# Patient Record
Sex: Female | Born: 1980
Health system: Southern US, Community
[De-identification: ages and names within clinical notes are randomized; demographics above are authoritative.]

## PROBLEM LIST (undated history)

## (undated) ENCOUNTER — Inpatient Hospital Stay (HOSPITAL_COMMUNITY): Payer: Self-pay

## (undated) DIAGNOSIS — Z789 Other specified health status: Secondary | ICD-10-CM

## (undated) HISTORY — PX: CERVICAL BIOPSY  W/ LOOP ELECTRODE EXCISION: SUR135

---

## 2015-07-15 ENCOUNTER — Other Ambulatory Visit: Payer: Self-pay | Admitting: Obstetrics & Gynecology

## 2015-07-15 ENCOUNTER — Other Ambulatory Visit (HOSPITAL_COMMUNITY)
Admission: RE | Admit: 2015-07-15 | Discharge: 2015-07-15 | Disposition: A | Discharge status: Home / Self Care | Payer: BLUE CROSS/BLUE SHIELD | Source: Ambulatory Visit | Attending: Obstetrics & Gynecology | Admitting: Obstetrics & Gynecology

## 2015-07-15 DIAGNOSIS — Z1151 Encounter for screening for human papillomavirus (HPV): Secondary | ICD-10-CM | POA: Insufficient documentation

## 2015-07-15 DIAGNOSIS — Z01419 Encounter for gynecological examination (general) (routine) without abnormal findings: Secondary | ICD-10-CM | POA: Diagnosis present

## 2015-07-21 LAB — CYTOLOGY - PAP

## 2015-12-07 DIAGNOSIS — O209 Hemorrhage in early pregnancy, unspecified: Secondary | ICD-10-CM | POA: Diagnosis not present

## 2015-12-09 DIAGNOSIS — O209 Hemorrhage in early pregnancy, unspecified: Secondary | ICD-10-CM | POA: Diagnosis not present

## 2015-12-13 DIAGNOSIS — O09521 Supervision of elderly multigravida, first trimester: Secondary | ICD-10-CM | POA: Diagnosis not present

## 2015-12-13 DIAGNOSIS — O26899 Other specified pregnancy related conditions, unspecified trimester: Secondary | ICD-10-CM | POA: Diagnosis not present

## 2015-12-13 DIAGNOSIS — O26851 Spotting complicating pregnancy, first trimester: Secondary | ICD-10-CM | POA: Diagnosis not present

## 2015-12-22 DIAGNOSIS — Z3480 Encounter for supervision of other normal pregnancy, unspecified trimester: Secondary | ICD-10-CM | POA: Diagnosis not present

## 2015-12-22 LAB — OB RESULTS CONSOLE RPR: RPR: NONREACTIVE

## 2015-12-22 LAB — OB RESULTS CONSOLE HEPATITIS B SURFACE ANTIGEN: HEP B S AG: NEGATIVE

## 2015-12-22 LAB — OB RESULTS CONSOLE RUBELLA ANTIBODY, IGM: RUBELLA: IMMUNE

## 2016-01-07 DIAGNOSIS — O09521 Supervision of elderly multigravida, first trimester: Secondary | ICD-10-CM | POA: Diagnosis not present

## 2016-01-07 LAB — OB RESULTS CONSOLE HIV ANTIBODY (ROUTINE TESTING): HIV: NONREACTIVE

## 2016-01-07 LAB — OB RESULTS CONSOLE GC/CHLAMYDIA
CHLAMYDIA, DNA PROBE: NEGATIVE
GC PROBE AMP, GENITAL: NEGATIVE

## 2016-02-17 DIAGNOSIS — O3442 Maternal care for other abnormalities of cervix, second trimester: Secondary | ICD-10-CM | POA: Diagnosis not present

## 2016-04-28 ENCOUNTER — Inpatient Hospital Stay (HOSPITAL_COMMUNITY): Payer: BLUE CROSS/BLUE SHIELD

## 2016-04-28 ENCOUNTER — Encounter (HOSPITAL_COMMUNITY): Payer: Self-pay | Admitting: *Deleted

## 2016-04-28 ENCOUNTER — Inpatient Hospital Stay (HOSPITAL_COMMUNITY)
Admission: AD | Admit: 2016-04-28 | Discharge: 2016-04-28 | Disposition: A | Discharge status: Home / Self Care | Payer: BLUE CROSS/BLUE SHIELD | Source: Ambulatory Visit | Attending: Obstetrics & Gynecology | Admitting: Obstetrics & Gynecology

## 2016-04-28 DIAGNOSIS — R109 Unspecified abdominal pain: Secondary | ICD-10-CM | POA: Insufficient documentation

## 2016-04-28 DIAGNOSIS — O26892 Other specified pregnancy related conditions, second trimester: Secondary | ICD-10-CM | POA: Insufficient documentation

## 2016-04-28 DIAGNOSIS — R102 Pelvic and perineal pain: Secondary | ICD-10-CM | POA: Diagnosis present

## 2016-04-28 DIAGNOSIS — Z3A25 25 weeks gestation of pregnancy: Secondary | ICD-10-CM | POA: Insufficient documentation

## 2016-04-28 DIAGNOSIS — O4702 False labor before 37 completed weeks of gestation, second trimester: Secondary | ICD-10-CM

## 2016-04-28 DIAGNOSIS — Z9889 Other specified postprocedural states: Secondary | ICD-10-CM

## 2016-04-28 DIAGNOSIS — O3440 Maternal care for other abnormalities of cervix, unspecified trimester: Secondary | ICD-10-CM

## 2016-04-28 HISTORY — DX: Other specified health status: Z78.9

## 2016-04-28 LAB — URINALYSIS, ROUTINE W REFLEX MICROSCOPIC
BILIRUBIN URINE: NEGATIVE
Glucose, UA: NEGATIVE mg/dL
HGB URINE DIPSTICK: NEGATIVE
Ketones, ur: NEGATIVE mg/dL
NITRITE: NEGATIVE
Protein, ur: NEGATIVE mg/dL
RBC / HPF: NONE SEEN RBC/hpf (ref 0–5)
SPECIFIC GRAVITY, URINE: 1.003 — AB (ref 1.005–1.030)
pH: 6 (ref 5.0–8.0)

## 2016-04-28 LAB — FETAL FIBRONECTIN: FETAL FIBRONECTIN: NEGATIVE

## 2016-04-28 NOTE — MAU Provider Note (Signed)
leep since last baby Ctx 10 times/hour  FFN and cerv length [place] pending  Chief Complaint:  Contractions   First Provider Initiated Contact with Patient 04/28/16 1047      HPI: Brandy Melendez is a 36 y.o. G2P1001 at [redacted]w[redacted]d who presents to maternity admissions reporting cramping/contractions 10+ times per hour for 2 days. She reports the cramping is uncomfortable but not painful, and relieves with rest but returns as soon as she stands up.  She is drinking plenty of water, and denies n/v.  Her cramping is associated with an occasional sharp vaginal pain, but she denies any regularity with these. There are no other associated symptoms. She is concerned about cervical shortening because she had a LEEP since her son was born at term by [place] in 2015.  She reports good fetal movement, denies LOF, vaginal bleeding, vaginal itching/burning, urinary symptoms, h/a, dizziness, or fever/chills.    HPI  Past Medical History: Past Medical History:  Diagnosis Date  . Medical history non-contributory     Past obstetric history: OB History  Gravida Para Term Preterm AB Living  SAB TAB Ectopic Multiple Live Births          1    # Outcome Date GA Lbr Len/2nd Weight Sex Delivery Anes PTL Lv  2 Current           1 Term 2015    M CS-LTranv Spinal  LIV      Past Surgical History: Past Surgical History:  Procedure Laterality Date  . CERVICAL BIOPSY  W/ LOOP ELECTRODE EXCISION    . CESAREAN SECTION      Family History: History reviewed. No pertinent family history.  Social History: Social History  Substance Use Topics  . Smoking status: Never Smoker  . Smokeless tobacco: Never Used  . Alcohol use No    Allergies: No Known Allergies  Meds:  No prescriptions prior to admission.    ROS:  Review of Systems  Constitutional: Negative for chills, fatigue and fever.  Respiratory: Negative for shortness of breath.   Cardiovascular: Negative for chest pain.  Gastrointestinal:  Positive for abdominal pain. Negative for diarrhea, nausea and vomiting.  Genitourinary: Positive for pelvic pain and vaginal pain. Negative for difficulty urinating, dysuria, flank pain, vaginal bleeding and vaginal discharge.  Neurological: Negative for dizziness and headaches.  Psychiatric/Behavioral: Negative.      I have reviewed patient's Past Medical Hx, Surgical Hx, Family Hx, Social Hx, medications and allergies.   Physical Exam   Patient Vitals for the past 24 hrs:  BP Temp Temp src Pulse Resp SpO2 Weight  04/28/16 1302 (!) 96/57 98 F (36.7 C) Oral 80 18 100 % -  04/28/16 0922 125/78 - Oral 100 16 100 % 126 lb 1.9 oz (57.2 kg)   Constitutional: Well-developed, well-nourished female in no acute distress.  Cardiovascular: normal rate Respiratory: normal effort GI: Abd soft, non-tender, gravid appropriate for gestational age.  MS: Extremities nontender, no edema, normal ROM Neurologic: Alert and oriented x 4.  GU: Neg CVAT.   Dilation: Closed Effacement (%): 50 Cervical Position: Posterior Exam by:: Sharen Counter CNM  FHT:  Baseline 145, moderate variability, accelerations present, no decelerations Contractions: irritability, mild to palpation   Labs: Results for orders placed or performed during the hospital encounter of 04/28/16 (from the past 24 hour(s))  Urinalysis, Routine w reflex microscopic     Status: Abnormal   Collection Time: 04/28/16  9:20  AM  Result Value Ref Range   Color, Urine STRAW (A) YELLOW   APPearance CLEAR CLEAR   Specific Gravity, Urine 1.003 (L) 1.005 - 1.030   pH 6.0 5.0 - 8.0   Glucose, UA NEGATIVE NEGATIVE mg/dL   Hgb urine dipstick NEGATIVE NEGATIVE   Bilirubin Urine NEGATIVE NEGATIVE   Ketones, ur NEGATIVE NEGATIVE mg/dL   Protein, ur NEGATIVE NEGATIVE mg/dL   Nitrite NEGATIVE NEGATIVE   Leukocytes, UA TRACE (A) NEGATIVE   RBC / HPF NONE SEEN 0 - 5 RBC/hpf   WBC, UA 0-5 0 - 5 WBC/hpf   Bacteria, UA RARE (A) NONE SEEN    Squamous Epithelial / LPF 6-30 (A) NONE SEEN  Fetal fibronectin     Status: None   Collection Time: 04/28/16 10:50 AM  Result Value Ref Range   Fetal Fibronectin NEGATIVE NEGATIVE      Imaging:  [place] Mfm Ob Transvaginal  Result Date: 04/28/2016 ----------------------------------------------------------------------  OBSTETRICS REPORT                      (Signed Final 04/28/2016 12:04 pm) ---------------------------------------------------------------------- Patient Info  ID #:       [id number]                          D.O.B.:  02-15-1980 (35 yrs)  Name:       Brandy Melendez                  Visit Date: 04/28/2016 11:21 am ---------------------------------------------------------------------- Performed By  Performed By:     Jodean Lima          Referred By:      MAU Nursing-                    RDMS                                     MAU/Triage  Attending:        Darlyn Read MD         Location:         [facility] ---------------------------------------------------------------------- Orders   #  Description                                 Code   1  [place] MFM OB TRANSVAGINAL                      [phone]  ----------------------------------------------------------------------   #  Ordered By               Order #        Accession #    Episode #   1  Fatiha Guzy LEFTWICH-           [id number]      [id number]     [id number]      KIRBY  ---------------------------------------------------------------------- Indications   [redacted] weeks gestation of pregnancy                Z3A.25   Previous cervical surgery (LEEP)               O34.40   Abdominal pain in pregnancy                    O99.89  History of cesarean delivery, currently        O34.219   pregnant  ---------------------------------------------------------------------- OB History  Gravidity:    2         Term:   1        Prem:   0        SAB:   0  TOP:          0       Ectopic:  0        Living: 1 ----------------------------------------------------------------------  Fetal Evaluation  Num Of Fetuses:     1  Fetal Heart         152  Rate(bpm):  Cardiac Activity:   Observed  Presentation:       Breech  Placenta:           Posterior Fundal, above cervical os  P. Cord Insertion:  Visualized, central  Amniotic Fluid  AFI FV:      Subjectively within normal limits                              Largest Pocket(cm)                              5.11  Comment:    Stomach and bladder visualized. ---------------------------------------------------------------------- Gestational Age  Clinical EDD:  25w 1d                                        EDD:   08/10/16  Best:          25w 1d     Det. By:  Clinical EDD             EDD:   08/10/16 ---------------------------------------------------------------------- Cervix Uterus Adnexa  Cervix  Length:           3.11  cm.  Normal appearance by transvaginal scan  Uterus  No abnormality visualized.  Left Ovary  Not visualized.  Right Ovary  Not visualized.  Adnexa:       No abnormality visualized. ---------------------------------------------------------------------- Impression  Single living intrauterine pregnancy at [redacted]w[redacted]d.  Breech presentation.  Posterior fundal placenta without evidence of previa.  Normal amniotic fluid volume.  The cervix measures 3.11cm without funneling or debris. ---------------------------------------------------------------------- Recommendations  Follow-up as clinically indicated. ----------------------------------------------------------------------                   Darlyn Read, MD Electronically Signed Final Report   04/28/2016 12:04 pm ----------------------------------------------------------------------   MAU Course/MDM: I have ordered U/A, FFN, and TVUS and reviewed results.  NST reviewed and reactive With closed cervix, negative FFN, unlikely preterm labor.  Cervical length 3.11 by TVUS today so no evidence of cervical insufficiency. Consult [facility] with presentation, exam findings and test results.  Reassurance  provided to pt. Preterm labor precautions reviewed. Pt to f/u in office as sheduled Pt stable at time of discharge.  Today's evaluation included a work-up for preterm labor which can be life-threatening for both mom and baby.  Assessment: 1. Threatened preterm labor, second trimester   2. Hx LEEP (loop electrosurgical excision procedure), cervix, pregnancy   3. Abdominal pain during pregnancy, second trimester   4. Preterm uterine contractions in second trimester, antepartum     Plan: Discharge home Labor precautions and fetal kick  counts  Follow-up Information    Myna Hidalgo, M, DO Follow up.   Specialty:  Obstetrics and Gynecology Why:  As scheduled, return to MAU as needed for emergencies Contact information: [address] [place] [address] [phone]          Allergies as of 04/28/2016   No Known Allergies     Medication List    TAKE these medications   prenatal multivitamin Tabs tablet Take 1 tablet by mouth daily at 12 noon.       Sharen Counter Certified Nurse-Midwife 04/28/2016 1:20 PM

## 2016-04-28 NOTE — MAU Note (Addendum)
Patient c/o contractions on and off for the past 2-3 weeks; states has gotten worse over the last couple of days. 10x's in the last hour. Patient states its better when she lays down. Denies LOF or VB. +FM. HX LEEP. Patient expresses concern for pre-term labor and cervical length.

## 2016-04-29 LAB — CULTURE, OB URINE: Culture: 20000 — AB

## 2016-08-03 ENCOUNTER — Encounter (HOSPITAL_COMMUNITY)
Admission: AD | Disposition: A | Discharge status: Home / Self Care | Payer: Self-pay | Source: Ambulatory Visit | Attending: Obstetrics and Gynecology

## 2016-08-03 ENCOUNTER — Inpatient Hospital Stay (HOSPITAL_COMMUNITY): Payer: BLUE CROSS/BLUE SHIELD | Admitting: Anesthesiology

## 2016-08-03 ENCOUNTER — Encounter (HOSPITAL_COMMUNITY): Payer: Self-pay

## 2016-08-03 ENCOUNTER — Inpatient Hospital Stay (HOSPITAL_COMMUNITY)
Admission: AD | Admit: 2016-08-03 | Discharge: 2016-08-05 | DRG: 765 | Disposition: A | Discharge status: Home / Self Care | Payer: BLUE CROSS/BLUE SHIELD | Source: Ambulatory Visit | Attending: Obstetrics and Gynecology | Admitting: Obstetrics and Gynecology

## 2016-08-03 DIAGNOSIS — I959 Hypotension, unspecified: Secondary | ICD-10-CM | POA: Diagnosis not present

## 2016-08-03 DIAGNOSIS — A6000 Herpesviral infection of urogenital system, unspecified: Secondary | ICD-10-CM | POA: Diagnosis present

## 2016-08-03 DIAGNOSIS — Z3A39 39 weeks gestation of pregnancy: Secondary | ICD-10-CM

## 2016-08-03 DIAGNOSIS — O9832 Other infections with a predominantly sexual mode of transmission complicating childbirth: Secondary | ICD-10-CM | POA: Diagnosis present

## 2016-08-03 DIAGNOSIS — O99824 Streptococcus B carrier state complicating childbirth: Secondary | ICD-10-CM | POA: Diagnosis present

## 2016-08-03 DIAGNOSIS — O34211 Maternal care for low transverse scar from previous cesarean delivery: Secondary | ICD-10-CM | POA: Diagnosis present

## 2016-08-03 DIAGNOSIS — Z98891 History of uterine scar from previous surgery: Secondary | ICD-10-CM

## 2016-08-03 LAB — RPR: RPR Ser Ql: NONREACTIVE

## 2016-08-03 LAB — CBC
HEMATOCRIT: 36.1 % (ref 36.0–46.0)
HEMOGLOBIN: 12.5 g/dL (ref 12.0–15.0)
MCH: 33.2 pg (ref 26.0–34.0)
MCHC: 34.6 g/dL (ref 30.0–36.0)
MCV: 95.8 fL (ref 78.0–100.0)
PLATELETS: 150 10*3/uL (ref 150–400)
RBC: 3.77 MIL/uL — AB (ref 3.87–5.11)
RDW: 14.3 % (ref 11.5–15.5)
WBC: 6.3 10*3/uL (ref 4.0–10.5)

## 2016-08-03 LAB — ABO/RH: ABO/RH(D): B POS

## 2016-08-03 LAB — TYPE AND SCREEN
ABO/RH(D): B POS
ANTIBODY SCREEN: NEGATIVE

## 2016-08-03 SURGERY — Surgical Case
Anesthesia: Monitor Anesthesia Care

## 2016-08-03 MED ORDER — COCONUT OIL OIL
1.0000 "application " | TOPICAL_OIL | Status: DC | PRN
  Filled 2016-08-03 (×2): qty 120

## 2016-08-03 MED ORDER — LACTATED RINGERS IV SOLN
INTRAVENOUS | Status: DC
  Administered 2016-08-03: 13:00:00 via INTRAVENOUS
  Administered 2016-08-03: 125 mL/h via INTRAVENOUS
  Administered 2016-08-04: 06:00:00 via INTRAVENOUS

## 2016-08-03 MED ORDER — MORPHINE SULFATE (PF) 0.5 MG/ML IJ SOLN
INTRAMUSCULAR | Status: AC
  Filled 2016-08-03: qty 10

## 2016-08-03 MED ORDER — SCOPOLAMINE 1 MG/3DAYS TD PT72
1.0000 | MEDICATED_PATCH | Freq: Once | TRANSDERMAL | Status: DC
  Filled 2016-08-03: qty 1

## 2016-08-03 MED ORDER — TETANUS-DIPHTH-ACELL PERTUSSIS 5-2.5-18.5 LF-MCG/0.5 IM SUSP: 0.5000 mL | Freq: Once | INTRAMUSCULAR | Status: DC

## 2016-08-03 MED ORDER — DEXAMETHASONE SODIUM PHOSPHATE 10 MG/ML IJ SOLN
INTRAMUSCULAR | Status: DC | PRN
  Administered 2016-08-03: 10 mg via INTRAVENOUS

## 2016-08-03 MED ORDER — DIPHENHYDRAMINE HCL 25 MG PO CAPS
25.0000 mg | ORAL_CAPSULE | ORAL | Status: DC | PRN
  Administered 2016-08-03: 25 mg via ORAL
  Filled 2016-08-03: qty 1

## 2016-08-03 MED ORDER — LACTATED RINGERS IV SOLN
INTRAVENOUS | Status: DC
Start: 2016-08-03 — End: 2016-08-03
  Administered 2016-08-03 (×2): via INTRAVENOUS

## 2016-08-03 MED ORDER — SENNOSIDES-DOCUSATE SODIUM 8.6-50 MG PO TABS
2.0000 | ORAL_TABLET | ORAL | Status: DC
  Administered 2016-08-04 (×2): 2 via ORAL
  Filled 2016-08-03 (×2): qty 2

## 2016-08-03 MED ORDER — ACETAMINOPHEN 325 MG PO TABS
650.0000 mg | ORAL_TABLET | ORAL | Status: DC | PRN
  Administered 2016-08-03: 650 mg via ORAL
  Filled 2016-08-03: qty 2

## 2016-08-03 MED ORDER — SCOPOLAMINE 1 MG/3DAYS TD PT72
MEDICATED_PATCH | TRANSDERMAL | Status: AC
  Filled 2016-08-03: qty 1

## 2016-08-03 MED ORDER — ZOLPIDEM TARTRATE 5 MG PO TABS: 5.0000 mg | ORAL_TABLET | Freq: Every evening | ORAL | Status: DC | PRN

## 2016-08-03 MED ORDER — IBUPROFEN 600 MG PO TABS
600.0000 mg | ORAL_TABLET | Freq: Four times a day (QID) | ORAL | Status: DC
  Administered 2016-08-03 – 2016-08-05 (×7): 600 mg via ORAL
  Filled 2016-08-03 (×7): qty 1

## 2016-08-03 MED ORDER — PHENYLEPHRINE 8 MG IN D5W 100 ML (0.08MG/ML) PREMIX OPTIME
INJECTION | INTRAVENOUS | Status: DC | PRN
  Administered 2016-08-03: 60 ug/min via INTRAVENOUS

## 2016-08-03 MED ORDER — SIMETHICONE 80 MG PO CHEW
80.0000 mg | CHEWABLE_TABLET | Freq: Three times a day (TID) | ORAL | Status: DC
  Administered 2016-08-03 – 2016-08-05 (×6): 80 mg via ORAL
  Filled 2016-08-03 (×5): qty 1

## 2016-08-03 MED ORDER — NALOXONE HCL 0.4 MG/ML IJ SOLN: 0.4000 mg | INTRAMUSCULAR | Status: DC | PRN

## 2016-08-03 MED ORDER — MORPHINE SULFATE (PF) 0.5 MG/ML IJ SOLN
INTRAMUSCULAR | Status: DC | PRN
  Administered 2016-08-03: .2 mg via INTRATHECAL

## 2016-08-03 MED ORDER — DIBUCAINE 1 % RE OINT: 1.0000 "application " | TOPICAL_OINTMENT | RECTAL | Status: DC | PRN

## 2016-08-03 MED ORDER — FENTANYL CITRATE (PF) 100 MCG/2ML IJ SOLN
INTRAMUSCULAR | Status: AC
  Filled 2016-08-03: qty 2

## 2016-08-03 MED ORDER — NALBUPHINE HCL 10 MG/ML IJ SOLN: 5.0000 mg | Freq: Once | INTRAMUSCULAR | Status: DC | PRN

## 2016-08-03 MED ORDER — ONDANSETRON HCL 4 MG/2ML IJ SOLN
INTRAMUSCULAR | Status: AC
  Filled 2016-08-03: qty 2

## 2016-08-03 MED ORDER — BUPIVACAINE IN DEXTROSE 0.75-8.25 % IT SOLN
INTRATHECAL | Status: AC
  Filled 2016-08-03: qty 2

## 2016-08-03 MED ORDER — MEPERIDINE HCL 25 MG/ML IJ SOLN: 6.2500 mg | INTRAMUSCULAR | Status: DC | PRN

## 2016-08-03 MED ORDER — SOD CITRATE-CITRIC ACID 500-334 MG/5ML PO SOLN
30.0000 mL | Freq: Once | ORAL | Status: AC
  Administered 2016-08-03: 30 mL via ORAL
  Filled 2016-08-03: qty 15

## 2016-08-03 MED ORDER — OXYCODONE-ACETAMINOPHEN 5-325 MG PO TABS
2.0000 | ORAL_TABLET | ORAL | Status: DC | PRN
  Filled 2016-08-03: qty 2

## 2016-08-03 MED ORDER — SODIUM CHLORIDE 0.9 % IR SOLN
Status: DC | PRN
  Administered 2016-08-03: 1

## 2016-08-03 MED ORDER — BUPIVACAINE IN DEXTROSE 0.75-8.25 % IT SOLN
INTRATHECAL | Status: DC | PRN
  Administered 2016-08-03: 1.6 mL via INTRATHECAL

## 2016-08-03 MED ORDER — SIMETHICONE 80 MG PO CHEW: 80.0000 mg | CHEWABLE_TABLET | ORAL | Status: DC | PRN

## 2016-08-03 MED ORDER — OXYCODONE-ACETAMINOPHEN 5-325 MG PO TABS
1.0000 | ORAL_TABLET | ORAL | Status: DC | PRN
  Administered 2016-08-04 – 2016-08-05 (×3): 1 via ORAL
  Filled 2016-08-03 (×2): qty 1

## 2016-08-03 MED ORDER — MENTHOL 3 MG MT LOZG: 1.0000 | LOZENGE | OROMUCOSAL | Status: DC | PRN

## 2016-08-03 MED ORDER — NALBUPHINE HCL 10 MG/ML IJ SOLN: 5.0000 mg | INTRAMUSCULAR | Status: DC | PRN

## 2016-08-03 MED ORDER — SCOPOLAMINE 1 MG/3DAYS TD PT72
MEDICATED_PATCH | TRANSDERMAL | Status: DC | PRN
  Administered 2016-08-03: 1 via TRANSDERMAL

## 2016-08-03 MED ORDER — DEXAMETHASONE SODIUM PHOSPHATE 10 MG/ML IJ SOLN
INTRAMUSCULAR | Status: AC
  Filled 2016-08-03: qty 1

## 2016-08-03 MED ORDER — PRENATAL MULTIVITAMIN CH
1.0000 | ORAL_TABLET | Freq: Every day | ORAL | Status: DC
  Administered 2016-08-04: 1 via ORAL
  Filled 2016-08-03: qty 1

## 2016-08-03 MED ORDER — LIDOCAINE HCL (CARDIAC) 20 MG/ML IV SOLN
INTRAVENOUS | Status: AC
  Filled 2016-08-03: qty 5

## 2016-08-03 MED ORDER — ONDANSETRON HCL 4 MG/2ML IJ SOLN: 4.0000 mg | Freq: Three times a day (TID) | INTRAMUSCULAR | Status: DC | PRN

## 2016-08-03 MED ORDER — PROMETHAZINE HCL 25 MG/ML IJ SOLN: 6.2500 mg | INTRAMUSCULAR | Status: DC | PRN

## 2016-08-03 MED ORDER — ONDANSETRON HCL 4 MG/2ML IJ SOLN
INTRAMUSCULAR | Status: DC | PRN
  Administered 2016-08-03: 4 mg via INTRAVENOUS

## 2016-08-03 MED ORDER — DIPHENHYDRAMINE HCL 50 MG/ML IJ SOLN: 12.5000 mg | INTRAMUSCULAR | Status: DC | PRN

## 2016-08-03 MED ORDER — KETOROLAC TROMETHAMINE 30 MG/ML IJ SOLN
30.0000 mg | Freq: Once | INTRAMUSCULAR | Status: AC
  Administered 2016-08-03: 30 mg via INTRAMUSCULAR

## 2016-08-03 MED ORDER — SODIUM CHLORIDE 0.9% FLUSH: 3.0000 mL | INTRAVENOUS | Status: DC | PRN

## 2016-08-03 MED ORDER — FENTANYL CITRATE (PF) 100 MCG/2ML IJ SOLN
INTRAMUSCULAR | Status: DC | PRN
  Administered 2016-08-03: 10 ug via INTRATHECAL

## 2016-08-03 MED ORDER — DIPHENHYDRAMINE HCL 25 MG PO CAPS: 25.0000 mg | ORAL_CAPSULE | Freq: Four times a day (QID) | ORAL | Status: DC | PRN

## 2016-08-03 MED ORDER — HYDROMORPHONE HCL 1 MG/ML IJ SOLN: 0.2500 mg | INTRAMUSCULAR | Status: DC | PRN

## 2016-08-03 MED ORDER — SIMETHICONE 80 MG PO CHEW
80.0000 mg | CHEWABLE_TABLET | ORAL | Status: DC
  Administered 2016-08-04 (×2): 80 mg via ORAL
  Filled 2016-08-03 (×2): qty 1

## 2016-08-03 MED ORDER — FAMOTIDINE IN NACL 20-0.9 MG/50ML-% IV SOLN
20.0000 mg | Freq: Once | INTRAVENOUS | Status: AC
  Administered 2016-08-03: 20 mg via INTRAVENOUS
  Filled 2016-08-03: qty 50

## 2016-08-03 MED ORDER — NALOXONE HCL 2 MG/2ML IJ SOSY
1.0000 ug/kg/h | PREFILLED_SYRINGE | INTRAMUSCULAR | Status: DC | PRN
  Filled 2016-08-03: qty 2

## 2016-08-03 MED ORDER — PHENYLEPHRINE 8 MG IN D5W 100 ML (0.08MG/ML) PREMIX OPTIME
INJECTION | INTRAVENOUS | Status: AC
  Filled 2016-08-03: qty 100

## 2016-08-03 MED ORDER — OXYTOCIN 10 UNIT/ML IJ SOLN
INTRAVENOUS | Status: DC | PRN
  Administered 2016-08-03: 40 [IU] via INTRAVENOUS

## 2016-08-03 MED ORDER — OXYTOCIN 40 UNITS IN LACTATED RINGERS INFUSION - SIMPLE MED: 2.5000 [IU]/h | INTRAVENOUS | Status: AC

## 2016-08-03 MED ORDER — CEFAZOLIN SODIUM-DEXTROSE 2-4 GM/100ML-% IV SOLN
2.0000 g | INTRAVENOUS | Status: AC
Start: 2016-08-03 — End: 2016-08-03
  Administered 2016-08-03: 2 g via INTRAVENOUS
  Filled 2016-08-03: qty 100

## 2016-08-03 MED ORDER — KETOROLAC TROMETHAMINE 30 MG/ML IJ SOLN
INTRAMUSCULAR | Status: AC
  Filled 2016-08-03: qty 1

## 2016-08-03 MED ORDER — SUCCINYLCHOLINE CHLORIDE 200 MG/10ML IV SOSY
PREFILLED_SYRINGE | INTRAVENOUS | Status: AC
  Filled 2016-08-03: qty 10

## 2016-08-03 MED ORDER — LACTATED RINGERS IV SOLN
INTRAVENOUS | Status: DC | PRN
  Administered 2016-08-03: 07:00:00 via INTRAVENOUS

## 2016-08-03 MED ORDER — WITCH HAZEL-GLYCERIN EX PADS: 1.0000 "application " | MEDICATED_PAD | CUTANEOUS | Status: DC | PRN

## 2016-08-03 MED ORDER — METHYLERGONOVINE MALEATE 0.2 MG PO TABS: 0.2000 mg | ORAL_TABLET | ORAL | Status: DC | PRN

## 2016-08-03 MED ORDER — OXYTOCIN 10 UNIT/ML IJ SOLN
INTRAMUSCULAR | Status: AC
  Filled 2016-08-03: qty 4

## 2016-08-03 MED ORDER — LIDOCAINE HCL 1 % IJ SOLN
INTRAMUSCULAR | Status: AC
  Filled 2016-08-03: qty 40

## 2016-08-03 MED ORDER — METHYLERGONOVINE MALEATE 0.2 MG/ML IJ SOLN: 0.2000 mg | INTRAMUSCULAR | Status: DC | PRN

## 2016-08-03 SURGICAL SUPPLY — 41 items
BARRIER ADHS 3X4 INTERCEED (GAUZE/BANDAGES/DRESSINGS) IMPLANT
BENZOIN TINCTURE PRP APPL 2/3 (GAUZE/BANDAGES/DRESSINGS) ×3 IMPLANT
CHLORAPREP W/TINT 26ML (MISCELLANEOUS) ×3 IMPLANT
CLAMP CORD UMBIL (MISCELLANEOUS) IMPLANT
CLOSURE STERI STRIP 1/2 X4 (GAUZE/BANDAGES/DRESSINGS) ×3 IMPLANT
CLOSURE WOUND 1/2 X4 (GAUZE/BANDAGES/DRESSINGS)
CLOTH BEACON ORANGE TIMEOUT ST (SAFETY) ×3 IMPLANT
DERMABOND ADVANCED (GAUZE/BANDAGES/DRESSINGS)
DERMABOND ADVANCED .7 DNX12 (GAUZE/BANDAGES/DRESSINGS) IMPLANT
DRSG OPSITE POSTOP 4X10 (GAUZE/BANDAGES/DRESSINGS) ×3 IMPLANT
ELECT REM PT RETURN 9FT ADLT (ELECTROSURGICAL) ×3
ELECTRODE REM PT RTRN 9FT ADLT (ELECTROSURGICAL) ×1 IMPLANT
EXTRACTOR VACUUM BELL STYLE (SUCTIONS) IMPLANT
GAUZE SPONGE 4X4 12PLY STRL LF (GAUZE/BANDAGES/DRESSINGS) ×6 IMPLANT
GLOVE BIO SURGEON STRL SZ7 (GLOVE) ×3 IMPLANT
GLOVE BIOGEL PI IND STRL 7.0 (GLOVE) ×2 IMPLANT
GLOVE BIOGEL PI INDICATOR 7.0 (GLOVE) ×4
GOWN STRL REUS W/TWL LRG LVL3 (GOWN DISPOSABLE) ×6 IMPLANT
HEMOSTAT SURGICEL 2X3 (HEMOSTASIS) ×3 IMPLANT
KIT ABG SYR 3ML LUER SLIP (SYRINGE) IMPLANT
NEEDLE HYPO 25X5/8 SAFETYGLIDE (NEEDLE) IMPLANT
NS IRRIG 1000ML POUR BTL (IV SOLUTION) ×3 IMPLANT
PACK C SECTION WH (CUSTOM PROCEDURE TRAY) ×3 IMPLANT
PAD ABD 7.5X8 STRL (GAUZE/BANDAGES/DRESSINGS) ×3 IMPLANT
PAD OB MATERNITY 4.3X12.25 (PERSONAL CARE ITEMS) ×3 IMPLANT
PENCIL SMOKE EVAC W/HOLSTER (ELECTROSURGICAL) ×3 IMPLANT
RTRCTR C-SECT PINK 25CM LRG (MISCELLANEOUS) ×3 IMPLANT
SPONGE LAP 18X18 X RAY DECT (DISPOSABLE) ×9 IMPLANT
STRIP CLOSURE SKIN 1/2X4 (GAUZE/BANDAGES/DRESSINGS) IMPLANT
SUT CHROMIC 0 CTX 36 (SUTURE) IMPLANT
SUT MON AB 4-0 PS1 27 (SUTURE) ×3 IMPLANT
SUT PLAIN 0 NONE (SUTURE) IMPLANT
SUT PLAIN 2 0 XLH (SUTURE) ×3 IMPLANT
SUT VIC AB 0 CTX 36 (SUTURE) ×10
SUT VIC AB 0 CTX36XBRD ANBCTRL (SUTURE) ×5 IMPLANT
SUT VIC AB 2-0 CT1 27 (SUTURE) ×2
SUT VIC AB 2-0 CT1 TAPERPNT 27 (SUTURE) ×1 IMPLANT
SUT VIC AB 3-0 SH 27 (SUTURE) ×4
SUT VIC AB 3-0 SH 27X BRD (SUTURE) ×2 IMPLANT
TOWEL OR 17X24 6PK STRL BLUE (TOWEL DISPOSABLE) ×3 IMPLANT
TRAY FOLEY BAG SILVER LF 14FR (SET/KITS/TRAYS/PACK) IMPLANT

## 2016-08-03 NOTE — Lactation Note (Signed)
This note was copied from a baby's chart. Lactation Consultation Note  Patient Name: Brandy Theodis BlazeMachiko Debroux UJWJX'BToday's Date: 08/03/2016 Reason for consult: Follow-up assessment;Difficult latch Mom called out for latch assist.  Baby more awake and rooting.  Attempted to latch baby to breast using cradle hold and football hold.  Both nipples inverted making latch very difficult for newborn.  20mm nipple shield applied to right breast and baby fed actively with good suck/swallows x 10 minutes.  Good amount of colostrum on nipple and in shield when baby came off.  Symphony pump set up and initiated.  Instructed mom to post pump and give make any colostrum by spoon or dropper.  Instructed to feed with cues and call for concerns/assist prn.  Maternal Data Has patient been taught Hand Expression?: Yes Does the patient have breastfeeding experience prior to this delivery?: Yes  Feeding Feeding Type: Breast Fed  LATCH Score/Interventions Latch: Grasps breast easily, tongue down, lips flanged, rhythmical sucking. (with 20 mm nipple shield) Intervention(s): Skin to skin;Teach feeding cues;Waking techniques Intervention(s): Breast compression;Breast massage;Assist with latch;Adjust position  Audible Swallowing: A few with stimulation Intervention(s): Hand expression;Alternate breast massage  Type of Nipple: Inverted Intervention(s): Double electric pump  Comfort (Breast/Nipple): Soft / non-tender     Hold (Positioning): Assistance needed to correctly position infant at breast and maintain latch. Intervention(s): Breastfeeding basics reviewed;Support Pillows;Position options;Skin to skin  LATCH Score: 6  Lactation Tools Discussed/Used Tools: Nipple Shields Nipple shield size: 20 Pump Review: Setup, frequency, and cleaning;Milk Storage Initiated by:: LC Date initiated:: 08/03/16   Consult Status Consult Status: Follow-up Date: 08/04/16 Follow-up type: In-patient    [name], Gracie Gupta  S 08/03/2016, 2:24 PM

## 2016-08-03 NOTE — Op Note (Deleted)
  The note originally documented on this encounter has been moved the the encounter in which it belongs.  

## 2016-08-03 NOTE — Anesthesia Postprocedure Evaluation (Signed)
Anesthesia Post Note  Patient: Theodis BlazeMachiko Mcminn  Procedure(s) Performed: Procedure(s) (LRB): CESAREAN SECTION (N/A)     Patient location during evaluation: PACU Anesthesia Type: Spinal Level of consciousness: oriented and awake and alert Pain management: pain level controlled Vital Signs Assessment: post-procedure vital signs reviewed and stable Respiratory status: spontaneous breathing, respiratory function stable and patient connected to nasal cannula oxygen Cardiovascular status: blood pressure returned to baseline and stable Postop Assessment: no headache and no backache Anesthetic complications: no    Last Vitals:  Vitals:   08/03/16 1010 08/03/16 1110  BP: (!) 100/55 (!) 102/54  Pulse: 66 67  Resp: 18 18  Temp: 36.9 C 36.8 C    Last Pain:  Vitals:   08/03/16 1200  TempSrc:   PainSc: 0-No pain   Pain Goal:                 Catheryn Baconyan P Jackqueline Aquilar

## 2016-08-03 NOTE — H&P (Signed)
Brandy Melendez is a 36 y.o. female G2 P1001 @ 39 0/7 weeks  presenting for contractions.  Started ~ 4hours ago.  Not very painful.  Denies bleeding or LOF.  Cervix was 1 cm in office.  On arrival it is 2 cm, 100% effaced.   Pt has a h/o primary c-section due to failure to progress at 8 cm.  Pt desires a repeat c-section.  Pt is a patient of Dr. Myna HidalgoJennifer Ozan.  Repeat c-section scheduled in a few days. OB History    Gravida Para Term Preterm AB Living   2 1 1     1    SAB TAB Ectopic Multiple Live Births           1     Past Medical History:  Diagnosis Date  . Medical history non-contributory    Past Surgical History:  Procedure Laterality Date  . CERVICAL BIOPSY  W/ LOOP ELECTRODE EXCISION    . CESAREAN SECTION     Family History: family history is not on file. Social History:  reports that she has never smoked. She has never used smokeless tobacco. She reports that she does not drink alcohol or use drugs.     Maternal Diabetes: No Genetic Screening: Normal Maternal Ultrasounds/Referrals: Normal Fetal Ultrasounds or other Referrals:  None Maternal Substance Abuse:  No Significant Maternal Medications:  Meds include: Other: Valtrex Significant Maternal Lab Results:  GBS Positive. Other Comments:  None  Review of Systems  Gastrointestinal: Positive for abdominal pain.   Maternal Medical History:  Reason for admission: Contractions.   Contractions: Onset was 3-5 hours ago.   Frequency: regular.   Perceived severity is mild.    Fetal activity: Perceived fetal activity is normal.    Prenatal complications: no prenatal complications Pt has a h/o HSV.  Prenatal Complications - Diabetes: none.    Dilation: 2 Effacement (%): 100 Station: -2 Exam by:: V Rogers RN  Blood pressure 97/63, pulse 71, temperature 97.7 F (36.5 C), temperature source Oral, resp. rate 18, height 5\' 2"  (1.575 m), weight 62.6 kg (138 lb). Maternal Exam:  Uterine Assessment: Contraction strength  is moderate.  Abdomen: Fetal presentation: vertex  Introitus: not evaluated.   Cervix: not evaluated.   Fetal Exam Fetal Monitor Review: Mode: fetoscope.   Baseline rate: 120s, .  Variability: minimal (<5 bpm).   Pattern: accelerations present.    Fetal State Assessment: Category I - tracings are normal.     Physical Exam  Constitutional: She is oriented to person, place, and time. She appears well-developed and well-nourished. No distress.  HENT:  Head: Normocephalic and atraumatic.  Eyes: EOM are normal.  Neck: Normal range of motion.  Respiratory: Effort normal. No respiratory distress.  GI: There is no tenderness.  Nontender over scar.  Musculoskeletal: Normal range of motion. She exhibits no tenderness.  Neurological: She is alert and oriented to person, place, and time.  Skin: Skin is warm and dry.  Psychiatric: She has a normal mood and affect.    Prenatal labs: ABO, Rh:   Antibody:   Rubella:   RPR:    HBsAg:    HIV:    GBS:   Positive  Assessment/Plan: IUP @ 39 0/7 weeks. H/o Previous c-section.  Desires repeat c-section. R/B/A reviewed with pt and husband. Pt's english is not extensive, husband interprets. GBS positive.  Intact membranes. Proceed with LTCS.   Geryl RankinsVARNADO, Elienai Gailey 08/03/2016, 5:16 AM

## 2016-08-03 NOTE — Consult Note (Signed)
Neonatology Note:   Attendance at C-section:    I was asked by Dr. Dion BodyVarnado to attend this repeat C/S at term due to onset of labor. The mother is a G2P1 B pos, GBS pos with history of HSV, on Valtrex suppression. ROM at delivery delivery, fluid clear. Infant vigorous with good spontaneous cry and tone. Delayed cord clamping was done. Needed only minimal bulb suctioning. Ap 9/9. Lungs clear to ausc in DR. To CN to care of Pediatrician.   Doretha Souhristie C. Karry Causer, MD

## 2016-08-03 NOTE — MAU Note (Signed)
Pt here with c/o contractions. Had previous C/S but is considering VBAC delivery.

## 2016-08-03 NOTE — Lactation Note (Signed)
This note was copied from a baby's chart. Lactation Consultation Note  Patient Name: Brandy Melendez AVWUJ'WToday's Date: 08/03/2016 Reason for consult: Follow-up assessment Mom called out for assist.  Baby is sleepy and not showing feeding cues.  Attempted to wake and latch but baby not ready.  Instructed to call when baby shows interest.  Maternal Data Has patient been taught Hand Expression?: Yes Does the patient have breastfeeding experience prior to this delivery?: Yes  Feeding Feeding Type: Breast Fed  LATCH Score/Interventions Latch: Too sleepy or reluctant, no latch achieved, no sucking elicited. Intervention(s): Adjust position;Assist with latch;Breast massage;Breast compression  Audible Swallowing: None  Type of Nipple: Inverted  Comfort (Breast/Nipple): Soft / non-tender     Hold (Positioning): Assistance needed to correctly position infant at breast and maintain latch. Intervention(s): Breastfeeding basics reviewed;Support Pillows;Position options;Skin to skin  LATCH Score: 3  Lactation Tools Discussed/Used     Consult Status Consult Status: Follow-up Date: 08/03/16 Follow-up type: In-patient    [name], Brandy Millay S 08/03/2016, 12:52 PM

## 2016-08-03 NOTE — Anesthesia Procedure Notes (Signed)
Spinal  Patient location during procedure: OR Start time: 08/03/2016 5:53 AM End time: 08/03/2016 6:03 AM Staffing Anesthesiologist: Heather RobertsSINGER, Niranjan Rufener Performed: anesthesiologist  Preanesthetic Checklist Completed: patient identified, surgical consent, pre-op evaluation, timeout performed, IV checked, risks and benefits discussed and monitors and equipment checked Spinal Block Patient position: sitting Prep: DuraPrep Patient monitoring: cardiac monitor, continuous pulse ox and blood pressure Approach: midline Location: L2-3 Injection technique: single-shot Needle Needle type: Pencan  Needle gauge: 24 G Needle length: 9 cm Additional Notes Functioning IV was confirmed and monitors were applied. Sterile prep and drape, including hand hygiene and sterile gloves were used. The patient was positioned and the spine was prepped. The skin was anesthetized with lidocaine.  Free flow of clear CSF was obtained prior to injecting local anesthetic into the CSF.  The spinal needle aspirated freely following injection.  The needle was carefully withdrawn.  The patient tolerated the procedure well.

## 2016-08-03 NOTE — Anesthesia Preprocedure Evaluation (Signed)
Anesthesia Evaluation  Patient identified by MRN, date of birth, ID band Patient awake    Reviewed: Allergy & Precautions, NPO status , Patient's Chart, lab work & pertinent test results  Airway Mallampati: II  TM Distance: >3 FB Neck ROM: Full    Dental no notable dental hx.    Pulmonary neg pulmonary ROS,    Pulmonary exam normal        Cardiovascular negative cardio ROS Normal cardiovascular exam     Neuro/Psych negative neurological ROS  negative psych ROS   GI/Hepatic negative GI ROS, Neg liver ROS,   Endo/Other  negative endocrine ROS  Renal/GU negative Renal ROS  negative genitourinary   Musculoskeletal negative musculoskeletal ROS (+)   Abdominal   Peds negative pediatric ROS (+)  Hematology negative hematology ROS (+)   Anesthesia Other Findings   Reproductive/Obstetrics (+) Pregnancy                             Anesthesia Physical Anesthesia Plan  ASA: II  Anesthesia Plan: MAC and Spinal   Post-op Pain Management:    Induction:   PONV Risk Score and Plan: 2 and Ondansetron and Dexamethasone  Airway Management Planned: Natural Airway  Additional Equipment:   Intra-op Plan:   Post-operative Plan:   Informed Consent: I have reviewed the patients History and Physical, chart, labs and discussed the procedure including the risks, benefits and alternatives for the proposed anesthesia with the patient or authorized representative who has indicated his/her understanding and acceptance.   Dental advisory given  Plan Discussed with: Anesthesiologist, Surgeon and CRNA  Anesthesia Plan Comments:         Anesthesia Quick Evaluation

## 2016-08-03 NOTE — Progress Notes (Signed)
Dr Richardson Doppole notified about  pt's blood pressure  No orders given

## 2016-08-03 NOTE — Brief Op Note (Signed)
08/03/2016  7:31 AM  PATIENT:  Brandy Melendez  36 y.o. female  PRE-OPERATIVE DIAGNOSIS:  IUP @ 39 /07 weeks,  elective repeat for labor  POST-OPERATIVE DIAGNOSIS:  Same  PROCEDURE:  Procedure(s): CESAREAN SECTION (N/A), Repeat LTCS, 2 layer closure.  SURGEON:  Surgeon(s) and Role:    Geryl Rankins* Louetta Hollingshead, MD - Primary  PHYSICIAN ASSISTANT:   ASSISTANTS: Sherre ScarletKimberly Williams, CNM   ANESTHESIA:   spinal  EBL:  Total I/O In: 400 [I.V.:400] Out: -  EBL ~ 800 ml BLOOD ADMINISTERED:none  DRAINS: Urinary Catheter (Foley)   LOCAL MEDICATIONS USED:  NONE  SPECIMEN:  No Specimen  DISPOSITION OF SPECIMEN:  n/a  COUNTS:  YES  TOURNIQUET:  * No tourniquets in log *  DICTATION: .Other Dictation: Dictation Number [id number]  PLAN OF CARE: Admit to inpatient   PATIENT DISPOSITION:  PACU - hemodynamically stable.   Delay start of Pharmacological VTE agent (>24hrs) due to surgical blood loss or risk of bleeding: yes

## 2016-08-03 NOTE — Lactation Note (Signed)
This note was copied from a baby's chart. Lactation Consultation Note  Patient Name: Girl Theodis BlazeMachiko Tuite NFAOZ'HToday's Date: 08/03/2016 Reason for consult: Follow-up assessment;Difficult latch   Maternal Data    Feeding Feeding Type: Breast Fed Length of feed: 15 min  LATCH Score/Interventions Latch: Grasps breast easily, tongue down, lips flanged, rhythmical sucking. (with 20 mm nipple shield) Intervention(s): Skin to skin;Teach feeding cues;Waking techniques Intervention(s): Breast compression;Breast massage;Assist with latch;Adjust position  Audible Swallowing: Spontaneous and intermittent Intervention(s): Alternate breast massage  Type of Nipple: Inverted Intervention(s): Double electric pump  Comfort (Breast/Nipple): Soft / non-tender     Hold (Positioning): Assistance needed to correctly position infant at breast and maintain latch. Intervention(s): Breastfeeding basics reviewed;Support Pillows;Position options;Skin to skin  LATCH Score: 7  Lactation Tools Discussed/Used Tools: Nipple Shields Nipple shield size: 20 Pump Review: Setup, frequency, and cleaning;Milk Storage Initiated by:: LC Date initiated:: 08/03/16   Consult Status Consult Status: Follow-up Date: 08/04/16 Follow-up type: In-patient    [name], Paitynn Mikus S 08/03/2016, 4:10 PM

## 2016-08-03 NOTE — Op Note (Signed)
[name]:  Brandy Melendez, Brandy Melendez              ACCOUNT NO.:  [id number]  MEDICAL RECORD NO.:  [id number]  LOCATION:                                FACILITY:  [facility]  PHYSICIAN:  Brandy PartridgeEvelyn B Floye Fesler, MD   DATE OF BIRTH:  1980/10/06  DATE OF PROCEDURE:  08/03/2016 DATE OF DISCHARGE:                              OPERATIVE REPORT   PREOPERATIVE DIAGNOSIS:  Intrauterine pregnancy at 39-0/7th weeks, elective repeat for labor.  POSTOPERATIVE DIAGNOSIS:  Intrauterine pregnancy at 39-0/7th weeks, elective repeat for labor.  PROCEDURE:  Repeat low-transverse cesarean section with 2-layer closure.  SURGEON:  Brandy PartridgeEvelyn B Clariece Roesler, MD  ASSISTANT:  Sherre ScarletKimberly Williams, certified nurse midwife.  ANESTHESIA:  Spinal.  ESTIMATED BLOOD LOSS:  800.  BLOOD ADMINISTERED:  None.  DRAINS:  Foley catheter.  SPECIMEN:  None.  PATIENT DISPOSITION:  To PACU, hemodynamically stable.  COMPLICATIONS:  None.  FINDINGS:  Viable female infant in the LOP position.  No nuchal cord. Clear amniotic fluid.  Apgars reassuring.  Normal fallopian tubes and ovaries bilaterally.  Lower uterine segment normal and no adhesions.  DESCRIPTION OF PROCEDURE:  The patient was identified in the holding area.  She was then taken to the operating room with IV running.  She underwent spinal anesthesia without complications.  She was then placed in the supine position.  SCDs were on and a Foley catheter was placed. She was prepped.  A time-out was performed.  Ancef 2 g IV was administered prior to the start of the procedure.  Time-out was taken. She was then draped and anesthesia was confirmed with an Allis clamp.  Incision was marked prior to placing the drape.  A Pfannenstiel skin incision was made with a scalpel and carried down to the underlying layer of the fascia with the Bovie on cut.  The patient had dense subcutaneous adhesions.  Fascial incision was then extended laterally with the curved Mayo scissors.  Rectus muscles were  dissected off the fascia.  Rectus muscles were adherent, so hemostat was used to grasp the muscle and then they were separated with the scalpel gently.  The peritoneum was opened sharply.  The intraperitoneal access was confirmed and the rectus muscles were separated and we had gone down with a scalpel to separate the muscles, but when we stretched, some of the peritoneal edge over the bladder tore, it was not hemostatic with cautery, so I used a 2-0 Vicryl to close the edge very superficially and hemostasis was achieved.  I initially was concerned that there was an injury to the bladder, but there was no blood in the urine and was again superficial.  Alexis retractor was then placed.  Lower uterine segment was identified. Serosa was entered sharply with the Metzenbaum scissors and extended laterally.  A transverse incision was then made and extended with the bandage scissors.  Baby's head was delivered atraumatically.  Nose and mouth were suctioned.  Delayed cord clamping was performed while we were awaiting the edges of the uterus were clamped.  Once 1 minute was reached, the cord was clamped x2 and cut.  Cord blood obtained. Placenta was then delivered and uterus was cleared of all clots and debris, then any  trailing membranes.  0 Vicryl was used to close the incision in a continuous locked fashion. Second layer of suture was used for hemostasis.  The serosa was very friable and edematous, so the incisions kind of pulled through and they were bleeding, so I used 2 U-stitches for hemostasis and cautery.  I would eventually need Surgicel just as a back up.  Copious irrigation was performed prior to placing the Surgicel.  Sherre ScarletKimberly Williams was present up until the closure of the first layer.  We then took the [place] out, and I wanted to make sure that the bladder was intact.  Sterile milk 200 was infused and it was clear that the edge that affected was very superficial when I closed  the peritoneum with 2-0 plain gut in a running locked fashion and looked that was also evident that it was the peritoneum.  The fascia was then reapproximated with 0 Vicryl in a continuous fashion, and the patient did not have a lot of subcutaneous fat, so the subcutaneous space was not reapproximated.  The skin was then closed with 4-0 Monocryl in a subcuticular fashion.  Pressure dressing, Steri- Strips, and benzoin were to be applied to the incision.  All instrument, sponge, and needle counts were correct x3.  The patient tolerated the procedure well.  Baby remained in the OR in stable condition.     Brandy PartridgeEvelyn B Marvena Tally, MD   ______________________________ Brandy PartridgeEvelyn B Bladimir Auman, MD    EBV/MEDQ  D:  08/03/2016  T:  08/03/2016  Job:  [id number]

## 2016-08-03 NOTE — Transfer of Care (Signed)
Immediate Anesthesia Transfer of Care Note  Patient: Brandy Melendez  Procedure(s) Performed: Procedure(s): CESAREAN SECTION (N/A)  Patient Location: PACU  Anesthesia Type:Spinal  Level of Consciousness: awake, alert  and oriented  Airway & Oxygen Therapy: Patient Spontanous Breathing  Post-op Assessment: Report given to RN and Post -op Vital signs reviewed and stable  Post vital signs: Reviewed and stable  Last Vitals:  Vitals:   08/03/16 0356  BP: 97/63  Pulse: 71  Resp: 18  Temp: 36.5 C    Last Pain:  Vitals:   08/03/16 0356  TempSrc: Oral  PainSc: 5          Complications: No apparent anesthesia complications

## 2016-08-03 NOTE — Lactation Note (Signed)
This note was copied from a baby's chart. Lactation Consultation Note  Patient Name: Brandy Melendez: 08/03/2016 Reason for consult: Initial assessment Breastfeeding consultation services and support information given and reviewed.  This is mom's second baby.  Baby is 5 hours old.  Mom states she has inverted nipples and used a nipple shield initially with first baby.  Newborn latched off and on in PACU.  Baby is currently sleeping in crib.  Instructed to watch for feeding cues and offer breast with cues.  LC phone number left to call for assist with next feeding.  Maternal Data Has patient been taught Hand Expression?: Yes Does the patient have breastfeeding experience prior to this delivery?: Yes  Feeding Feeding Type: Breast Fed Length of feed: 20 min (off and on, infant eager but slipping off due to inverted )  LATCH Score/Interventions Latch: Repeated attempts needed to sustain latch, nipple held in mouth throughout feeding, stimulation needed to elicit sucking reflex. Intervention(s): Adjust position;Assist with latch;Breast compression  Audible Swallowing: None Intervention(s): Skin to skin;Hand expression  Type of Nipple: Inverted Intervention(s): Reverse pressure (in PACU)  Comfort (Breast/Nipple): Soft / non-tender     Hold (Positioning): Assistance needed to correctly position infant at breast and maintain latch.  LATCH Score: 4  Lactation Tools Discussed/Used     Consult Status Consult Status: Follow-up Melendez: 08/03/16 Follow-up type: In-patient    [name], Anelle Parlow S 08/03/2016, 11:37 AM

## 2016-08-04 LAB — CBC
HCT: 32 % — ABNORMAL LOW (ref 36.0–46.0)
HEMATOCRIT: 30.2 % — AB (ref 36.0–46.0)
HEMOGLOBIN: 11.1 g/dL — AB (ref 12.0–15.0)
Hemoglobin: 10.7 g/dL — ABNORMAL LOW (ref 12.0–15.0)
MCH: 34.2 pg — ABNORMAL HIGH (ref 26.0–34.0)
MCH: 33.7 pg (ref 26.0–34.0)
MCHC: 35.4 g/dL (ref 30.0–36.0)
MCHC: 34.7 g/dL (ref 30.0–36.0)
MCV: 96.5 fL (ref 78.0–100.0)
MCV: 97.3 fL (ref 78.0–100.0)
PLATELETS: 141 10*3/uL — AB (ref 150–400)
Platelets: 136 10*3/uL — ABNORMAL LOW (ref 150–400)
RBC: 3.13 MIL/uL — AB (ref 3.87–5.11)
RBC: 3.29 MIL/uL — AB (ref 3.87–5.11)
RDW: 14.4 % (ref 11.5–15.5)
RDW: 14.4 % (ref 11.5–15.5)
WBC: 7.9 10*3/uL (ref 4.0–10.5)
WBC: 7.1 10*3/uL (ref 4.0–10.5)

## 2016-08-04 LAB — BIRTH TISSUE RECOVERY COLLECTION (PLACENTA DONATION)

## 2016-08-04 NOTE — Plan of Care (Signed)
Problem: Education: Goal: Knowledge of condition will improve Outcome: Progressing Educated and supported with breast feeding.  Encouraged mom to pump between feedings and spoon feed expressed colostrum and drops of colostrum in flange and bottles to encourage increased supply.

## 2016-08-04 NOTE — Lactation Note (Signed)
This note was copied from a baby's chart. Lactation Consultation Note  Patient Name: Brandy Melendez BJYNW'GToday's Date: 08/04/2016 Reason for consult: Follow-up assessment;Breast/nipple pain;Difficult latch Mom's nipples are beginning to evert more since pumping.  Nipples abraded and sore.  Assisted with postioning baby in cross cradle on left side. Left nipple larger than the right and more challenging for baby to latch.  Attempted to latch with and without shield with no good grasp of breast obtained.  A 24 mm nipple shield is needed for nipple size but is large for baby's mouth.  Baby moved to right breast in cross cradle hold.  Baby latched without shield after a few attempts and latch deep.  Mom uncomfortable initially but pain improved as baby fed.  Good swallows noted.  Mom is post pumping every 3 hours but no milk obtained.  Comfort gels given with instructions.  LC phone number left for prn assist.  Maternal Data    Feeding Feeding Type: Breast Fed Length of feed: 25 min  LATCH Score/Interventions Latch: Grasps breast easily, tongue down, lips flanged, rhythmical sucking. Intervention(s): Skin to skin;Teach feeding cues;Waking techniques Intervention(s): Breast compression;Breast massage;Assist with latch;Adjust position  Audible Swallowing: Spontaneous and intermittent  Type of Nipple: Inverted Intervention(s): Double electric pump  Comfort (Breast/Nipple): Filling, red/small blisters or bruises, mild/mod discomfort  Problem noted: Cracked, bleeding, blisters, bruises;Mild/Moderate discomfort Interventions (Mild/moderate discomfort): Comfort gels  Hold (Positioning): Assistance needed to correctly position infant at breast and maintain latch.  LATCH Score: 6  Lactation Tools Discussed/Used     Consult Status      [name], [name] 08/04/2016, 10:58 AM

## 2016-08-04 NOTE — Progress Notes (Signed)
Subjective: Postop Day 1: Cesarean Delivery No complaints.  Pain controlled.  Lochia normal.  Breast feeding yes. Pt had hypotension overnight.  Orthostatics negative.  Pt states she normally has low BP, denies dizziness.  Prenatal records reviewed.  Antepartum BPs were 90/60s.  Objective: Temp:  [97.4 F (36.3 C)-98.9 F (37.2 C)] 97.7 F (36.5 C) (07/27 0503) Pulse Rate:  [63-76] 74 (07/27 0507) Resp:  [16-21] 16 (07/27 0503) BP: (79-102)/(40-82) 81/52 (07/27 0507) SpO2:  [96 %-100 %] 96 % (07/26 2152)  Physical Exam: Gen: NAD Lochia: Not visualized Uterine Fundus: firm, appropriately tender Incision: Dressing clean and dry DVT Evaluation: mild  Edema present, no calf tenderness bilaterally    Recent Labs  08/03/16 0500 08/04/16 0509  HGB 12.5 10.7*  HCT 36.1 30.2*    Assessment/Plan: Status post C-section-doing well postoperatively. Hypotension likely pt's normal baseline.  Asymptomatic.  Repeat CBC in 8 hours. Lactation support. Encouraged ambulation in halls. Discharge tomorrow by [facility].  Pt aware.    Brandy Melendez 08/04/2016, 8:08 AM

## 2016-08-05 MED ORDER — IBUPROFEN 600 MG PO TABS: 600.0000 mg | ORAL_TABLET | Freq: Four times a day (QID) | ORAL | 0 refills | Status: AC

## 2016-08-05 MED ORDER — OXYCODONE-ACETAMINOPHEN 5-325 MG PO TABS: 1.0000 | ORAL_TABLET | ORAL | 0 refills | Status: AC | PRN

## 2016-08-05 NOTE — Discharge Summary (Signed)
OB Discharge Summary     Patient Name: Brandy Melendez DOB: [birth year] MRN: [id number]  Date of admission: 08/03/2016 Delivering MD: Geryl RankinsVARNADO, EVELYN   Date of discharge: 08/05/2016  Admitting diagnosis: 39 WEEKS CTX Intrauterine pregnancy: 3475w0d     Secondary diagnosis:  Active Problems:   S/P repeat low transverse C-section  Additional problems: None     Discharge diagnosis: Term Pregnancy Delivered                                                                                                Post partum procedures:None  Augmentation: None  Complications: None  Hospital course:  Sceduled C/S   36 y.o. yo G2P2002 at 7675w0d was admitted to the hospital 08/03/2016 for scheduled cesarean section with the following indication:Elective Repeat.  Membrane Rupture Time/Date: 6:30 AM ,08/03/2016   Patient delivered a Viable infant.08/03/2016  Details of operation can be found in separate operative note.  Pateint had an uncomplicated postpartum course.  She is ambulating, tolerating a regular diet, passing flatus, and urinating well. Patient is discharged home in stable condition on  08/05/16         Physical exam  Vitals:   08/04/16 0505 08/04/16 0507 08/04/16 1809 08/05/16 0531  BP: (!) 79/49 (!) 81/52 92/63 (!) 97/50  Pulse: 72 74 73 66  Resp:   18 16  Temp:      TempSrc:      SpO2:      Weight:      Height:       General: alert, cooperative and no distress Lochia: appropriate Uterine Fundus: firm Incision: Healing well with no significant drainage DVT Evaluation: No evidence of DVT seen on physical exam. Negative Homan's sign. Labs: Lab Results  Component Value Date   WBC 7.1 08/04/2016   HGB 11.1 (L) 08/04/2016   HCT 32.0 (L) 08/04/2016   MCV 97.3 08/04/2016   PLT 141 (L) 08/04/2016   No flowsheet data found.  Discharge instruction: per After Visit Summary and "Baby and Me Booklet".  After visit meds:  Allergies as of 08/05/2016   No Known Allergies      Medication List    TAKE these medications   ferrous sulfate 325 (65 FE) MG tablet Take 325 mg by mouth daily with breakfast.   ibuprofen 600 MG tablet Commonly known as:  ADVIL,MOTRIN Take 1 tablet (600 mg total) by mouth every 6 (six) hours.   oxyCODONE-acetaminophen 5-325 MG tablet Commonly known as:  PERCOCET/ROXICET Take 1 tablet by mouth every 4 (four) hours as needed (pain scale 4-7).   prenatal multivitamin Tabs tablet Take 1 tablet by mouth daily at 12 noon.   valACYclovir 500 MG tablet Commonly known as:  VALTREX TK 1 T PO BID       Diet: routine diet  Activity: Advance as tolerated. Pelvic rest for 6 weeks.   Outpatient follow up:2 weeks Follow up Appt:No future appointments. Follow up Visit:No Follow-up on file.  Postpartum contraception: Undecided  Newborn Data: Live born female  Birth Weight: 6 lb 13.7 oz (3110 g) APGAR: 9, 9  Baby  Feeding: Breast Disposition:home with mother   08/05/2016 Brandy Melendez, CNM

## 2016-08-07 ENCOUNTER — Inpatient Hospital Stay (HOSPITAL_COMMUNITY)
Admission: RE | Admit: 2016-08-07 | Payer: BLUE CROSS/BLUE SHIELD | Source: Ambulatory Visit | Admitting: Obstetrics & Gynecology

## 2016-08-07 SURGERY — Surgical Case
Anesthesia: Regional

## 2016-10-30 DIAGNOSIS — Z1389 Encounter for screening for other disorder: Secondary | ICD-10-CM | POA: Diagnosis not present

## 2016-10-30 DIAGNOSIS — Z0000 Encounter for general adult medical examination without abnormal findings: Secondary | ICD-10-CM | POA: Diagnosis not present

## 2016-10-30 DIAGNOSIS — D72819 Decreased white blood cell count, unspecified: Secondary | ICD-10-CM | POA: Diagnosis not present

## 2016-10-30 DIAGNOSIS — Z6822 Body mass index (BMI) 22.0-22.9, adult: Secondary | ICD-10-CM | POA: Diagnosis not present

## 2016-11-13 DIAGNOSIS — Z1212 Encounter for screening for malignant neoplasm of rectum: Secondary | ICD-10-CM | POA: Diagnosis not present

## 2017-02-28 DIAGNOSIS — R509 Fever, unspecified: Secondary | ICD-10-CM | POA: Diagnosis not present

## 2017-02-28 DIAGNOSIS — J111 Influenza due to unidentified influenza virus with other respiratory manifestations: Secondary | ICD-10-CM | POA: Diagnosis not present

## 2017-02-28 DIAGNOSIS — J019 Acute sinusitis, unspecified: Secondary | ICD-10-CM | POA: Diagnosis not present

## 2017-02-28 DIAGNOSIS — J029 Acute pharyngitis, unspecified: Secondary | ICD-10-CM | POA: Diagnosis not present

## 2017-10-30 DIAGNOSIS — R82998 Other abnormal findings in urine: Secondary | ICD-10-CM | POA: Diagnosis not present

## 2017-10-30 DIAGNOSIS — Z0000 Encounter for general adult medical examination without abnormal findings: Secondary | ICD-10-CM | POA: Diagnosis not present

## 2017-11-06 ENCOUNTER — Other Ambulatory Visit: Payer: Self-pay | Admitting: Internal Medicine

## 2017-11-06 DIAGNOSIS — Z23 Encounter for immunization: Secondary | ICD-10-CM | POA: Diagnosis not present

## 2017-11-06 DIAGNOSIS — Z0000 Encounter for general adult medical examination without abnormal findings: Secondary | ICD-10-CM | POA: Diagnosis not present

## 2017-11-06 DIAGNOSIS — Z681 Body mass index (BMI) 19 or less, adult: Secondary | ICD-10-CM | POA: Diagnosis not present

## 2017-11-06 DIAGNOSIS — Z1389 Encounter for screening for other disorder: Secondary | ICD-10-CM | POA: Diagnosis not present

## 2017-11-06 DIAGNOSIS — D72819 Decreased white blood cell count, unspecified: Secondary | ICD-10-CM | POA: Diagnosis not present

## 2017-11-06 DIAGNOSIS — R109 Unspecified abdominal pain: Secondary | ICD-10-CM

## 2017-11-07 ENCOUNTER — Other Ambulatory Visit: Payer: Self-pay | Admitting: Internal Medicine

## 2017-11-07 DIAGNOSIS — R109 Unspecified abdominal pain: Secondary | ICD-10-CM

## 2017-11-16 ENCOUNTER — Ambulatory Visit
Admission: RE | Admit: 2017-11-16 | Discharge: 2017-11-16 | Disposition: A | Discharge status: Home / Self Care | Payer: BLUE CROSS/BLUE SHIELD | Source: Ambulatory Visit | Attending: Internal Medicine | Admitting: Internal Medicine

## 2017-11-16 DIAGNOSIS — R1030 Lower abdominal pain, unspecified: Secondary | ICD-10-CM | POA: Diagnosis not present

## 2017-11-16 DIAGNOSIS — R109 Unspecified abdominal pain: Secondary | ICD-10-CM

## 2017-11-16 MED ORDER — IOPAMIDOL (ISOVUE-300) INJECTION 61%
100.0000 mL | Freq: Once | INTRAVENOUS | Status: AC | PRN
  Administered 2017-11-16: 100 mL via INTRAVENOUS

## 2018-09-17 ENCOUNTER — Emergency Department (HOSPITAL_COMMUNITY)
Admission: EM | Admit: 2018-09-17 | Discharge: 2018-09-17 | Disposition: A | Discharge status: Home / Self Care | Payer: BC Managed Care – PPO | Attending: Emergency Medicine | Admitting: Emergency Medicine

## 2018-09-17 ENCOUNTER — Other Ambulatory Visit: Payer: Self-pay

## 2018-09-17 ENCOUNTER — Emergency Department (HOSPITAL_COMMUNITY): Payer: BC Managed Care – PPO

## 2018-09-17 ENCOUNTER — Encounter (HOSPITAL_COMMUNITY): Payer: Self-pay | Admitting: Emergency Medicine

## 2018-09-17 DIAGNOSIS — R002 Palpitations: Secondary | ICD-10-CM | POA: Insufficient documentation

## 2018-09-17 DIAGNOSIS — R0789 Other chest pain: Secondary | ICD-10-CM | POA: Insufficient documentation

## 2018-09-17 DIAGNOSIS — R079 Chest pain, unspecified: Secondary | ICD-10-CM | POA: Diagnosis not present

## 2018-09-17 DIAGNOSIS — Z6379 Other stressful life events affecting family and household: Secondary | ICD-10-CM | POA: Insufficient documentation

## 2018-09-17 LAB — BASIC METABOLIC PANEL
Anion gap: 9 (ref 5–15)
BUN: 17 mg/dL (ref 6–20)
CO2: 24 mmol/L (ref 22–32)
Calcium: 9 mg/dL (ref 8.9–10.3)
Chloride: 105 mmol/L (ref 98–111)
Creatinine, Ser: 0.62 mg/dL (ref 0.44–1.00)
GFR calc Af Amer: >60 mL/min (ref >60)
GFR calc non Af Amer: >60 mL/min (ref >60)
Glucose, Bld: 114 mg/dL — ABNORMAL HIGH (ref 70–99)
Potassium: 3.7 mmol/L (ref 3.5–5.1)
Sodium: 138 mmol/L (ref 135–145)

## 2018-09-17 LAB — TROPONIN I (HIGH SENSITIVITY)
Troponin I (High Sensitivity): 2 ng/L (ref <18)
Troponin I (High Sensitivity): <2 ng/L (ref <18)

## 2018-09-17 LAB — CBC
HCT: 37 % (ref 36.0–46.0)
Hemoglobin: 12.5 g/dL (ref 12.0–15.0)
MCH: 31.3 pg (ref 26.0–34.0)
MCHC: 33.8 g/dL (ref 30.0–36.0)
MCV: 92.7 fL (ref 80.0–100.0)
Platelets: 158 10*3/uL (ref 150–400)
RBC: 3.99 MIL/uL (ref 3.87–5.11)
RDW: 11.9 % (ref 11.5–15.5)
WBC: 3.3 10*3/uL — ABNORMAL LOW (ref 4.0–10.5)
nRBC: 0 % (ref 0.0–0.2)

## 2018-09-17 LAB — I-STAT BETA HCG BLOOD, ED (MC, WL, AP ONLY): I-stat hCG, quantitative: <5 m[IU]/mL (ref <5)

## 2018-09-17 MED ORDER — SODIUM CHLORIDE 0.9% FLUSH
3.0000 mL | Freq: Once | INTRAVENOUS | Status: AC
  Administered 2018-09-17: 3 mL via INTRAVENOUS

## 2018-09-17 NOTE — ED Triage Notes (Signed)
Pt reports left sided chest pain for the last 4 days. Reports pain lasts about 10 minutes then goes away. Reports weakness but denies diqaphoresis, SOB, or radiation of the pain. Reports palpitations. No hx of heart dz. VSS.

## 2018-09-17 NOTE — ED Provider Notes (Signed)
[facility] Note   CSN: [id number] Arrival date & time: 09/17/18  1319     History   Chief Complaint Chief Complaint  Patient presents with  . Chest Pain    HPI Brandy Melendez is a 38 y.o. female without significant past medical history, presenting to the emergency department 4 days of intermittent episodes of left-sided chest pain that she describes as a pressure sensation.  She states the episodes rest and have no associated nausea, diaphoresis or radiation of pain.  No associated shortness of breath.  She states she has had increased stress level at home lately with her child doing home schooling and a 40-year-old also at home.  She states she had some sensation of heart pounding today while waiting in the waiting room though that was the first time that occurred. She has no cardiac history.  No history of smoking, hypertension, hyperlipidemia, diabetes.  She states she is overall very healthy does not take any medications.  She is followed by PCP.     The history is provided by the patient.    Past Medical History:  Diagnosis Date  . Medical history non-contributory     Patient Active Problem List   Diagnosis Date Noted  . S/P repeat low transverse C-section 08/03/2016    Past Surgical History:  Procedure Laterality Date  . CERVICAL BIOPSY  W/ LOOP ELECTRODE EXCISION    . CESAREAN SECTION    . CESAREAN SECTION N/A 08/03/2016   Procedure: CESAREAN SECTION;  Surgeon: Geryl RankinsVarnado, Evelyn, MD;  [facility];  Service: Obstetrics;  Laterality: N/A;     OB History    Gravida  2   Para  2   Term  2   Preterm      AB      Living  2     SAB      TAB      Ectopic      Multiple  0   Live Births  2            Home Medications    Prior to Admission medications   Medication Sig Start Date End Date Taking? Authorizing Provider  Multiple Vitamins-Minerals (MULTIVITAMIN ADULT PO) Take 1 tablet by  mouth daily.   Yes [provider]  ibuprofen (ADVIL,MOTRIN) 600 MG tablet Take 1 tablet (600 mg total) by mouth every 6 (six) hours. Patient not taking: Reported on 09/17/2018 08/05/16   Prothero, Henderson NewcomerNancy Jean, CNM  oxyCODONE-acetaminophen (PERCOCET/ROXICET) 5-325 MG tablet Take 1 tablet by mouth every 4 (four) hours as needed (pain scale 4-7). Patient not taking: Reported on 09/17/2018 08/05/16   Prothero, Henderson NewcomerNancy Jean, CNM    Family History History reviewed. No pertinent family history.  Social History Social History   Tobacco Use  . Smoking status: Never Smoker  . Smokeless tobacco: Never Used  Substance Use Topics  . Alcohol use: No  . Drug use: No     Allergies   Patient has no known allergies.   Review of Systems Review of Systems  Constitutional: Negative for fever.  Respiratory: Negative for cough and shortness of breath.   Cardiovascular: Positive for chest pain and palpitations. Negative for leg swelling.  Gastrointestinal: Negative for abdominal pain and nausea.  All other systems reviewed and are negative.    Physical Exam Updated Vital Signs BP 115/87   Pulse 81   Temp 98.3 F (36.8 C)   Resp 19   Ht 5'  2.99" (1.6 m)   Wt 48 kg   LMP 09/15/2018   SpO2 100%   BMI 18.75 kg/m   Physical Exam Vitals signs and nursing note reviewed.  Constitutional:      General: She is not in acute distress.    Appearance: She is well-developed. She is not ill-appearing.  HENT:     Head: Normocephalic and atraumatic.  Eyes:     Conjunctiva/sclera: Conjunctivae normal.  Neck:     Musculoskeletal: Normal range of motion and neck supple.  Cardiovascular:     Rate and Rhythm: Normal rate and regular rhythm.  Pulmonary:     Effort: Pulmonary effort is normal. No respiratory distress.     Breath sounds: Normal breath sounds.  Chest:     Chest wall: No tenderness.  Abdominal:     General: Bowel sounds are normal.     Palpations: Abdomen is soft.     Tenderness:  There is no abdominal tenderness. There is no guarding or rebound.  Musculoskeletal:     Right lower leg: No edema.     Left lower leg: No edema.  Skin:    General: Skin is warm.  Neurological:     Mental Status: She is alert.  Psychiatric:        Behavior: Behavior normal.      ED Treatments / Results  Labs (all labs ordered are listed, but only abnormal results are displayed) Labs Reviewed  BASIC METABOLIC PANEL - Abnormal; Notable for the following components:      Result Value   Glucose, Bld 114 (*)    All other components within normal limits  CBC - Abnormal; Notable for the following components:   WBC 3.3 (*)    All other components within normal limits  I-STAT BETA HCG BLOOD, ED ([facility], [facility], [facility] ONLY)  TROPONIN I (HIGH SENSITIVITY)  TROPONIN I (HIGH SENSITIVITY)    EKG EKG Interpretation  Date/Time:  Tuesday September 17 2018 13:36:22 EDT Ventricular Rate:  87 PR Interval:  112 QRS Duration: 90 QT Interval:  360 QTC Calculation: 433 R Axis:   86 Text Interpretation:  Normal sinus rhythm Normal ECG No old tracing to compare Confirmed by Aletta Edouard [phone]) on 09/17/2018 8:59:51 PM   Radiology Dg Chest 2 View  Result Date: 09/17/2018 CLINICAL DATA:  Chest pain EXAM: CHEST - 2 VIEW COMPARISON:  None. FINDINGS: The heart size and mediastinal contours are within normal limits. Both lungs are clear. The visualized skeletal structures are unremarkable. IMPRESSION: No active cardiopulmonary disease. Electronically Signed   By: Davina Poke M.D.   On: 09/17/2018 16:04    Procedures Procedures (including critical care time)  Medications Ordered in ED Medications  sodium chloride flush (NS) 0.9 % injection 3 mL (3 mLs Intravenous Given 09/17/18 2100)     Initial Impression / Assessment and Plan / ED Course  I have reviewed the triage vital signs and the nursing notes.  Pertinent labs & imaging results that were available during my care of the patient were  reviewed by me and considered in my medical decision making (see chart for details).        Pt presenting with intermittent chest pain over the last 4 days. Also endorses increased stress at home. No other assoc sx.  Chest pain is not likely of cardiac or pulmonary etiology d/t presentation, PERC negative, VSS, no tracheal deviation, no JVD or new murmur, RRR, breath sounds equal bilaterally, EKG without acute abnormalities, negative troponin x2, and  negative CXR. No known risk factors for cardiac dz. Patient is to be discharged with recommendation to follow up with PCP in regards to today's hospital visit. Pt has been advised to return to the ED if CP becomes exertional, associated with diaphoresis or nausea, radiates to left jaw/arm, worsens or becomes concerning in any way. Pt appears reliable for follow up and is agreeable to discharge.   Discussed results, findings, treatment and follow up. Patient advised of return precautions. Patient verbalized understanding and agreed with plan.  Final Clinical Impressions(s) / ED Diagnoses   Final diagnoses:  Atypical chest pain    ED Discharge Orders    None       Cerina Leary, [place] N, PA-C 09/17/18 2207    Terrilee Files, MD 09/18/18 1101

## 2018-09-17 NOTE — Discharge Instructions (Signed)
Please read instructions below. °Follow up with your primary care provider regarding your visit today. ° Return to the ER for new or worsening symptoms; including worsening chest pain, shortness of breath, pain that radiates to the arm or neck, pain or shortness of breath worsened with exertion.  ° °

## 2018-11-15 IMAGING — US US MFM OB TRANSVAGINAL
1 series · 15 of 28 positions shown · non-contrast
Comparison: none

[Series 1: us mfm ob transvaginal · 46 acquisitions, 15 frames shown]
[im 1/46]
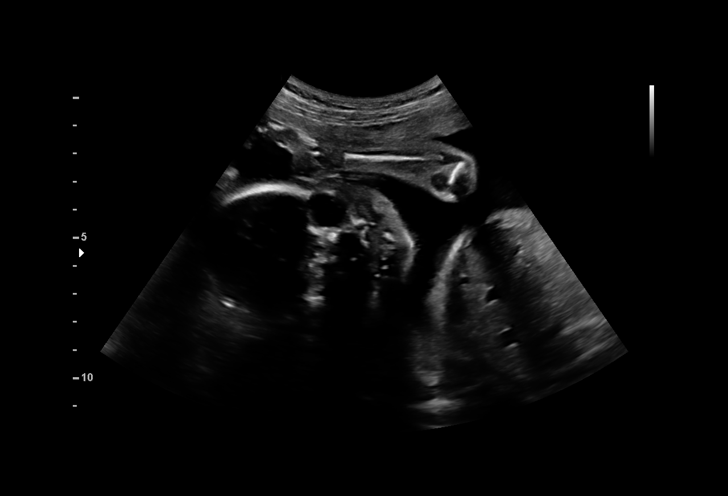
[im 4/46]
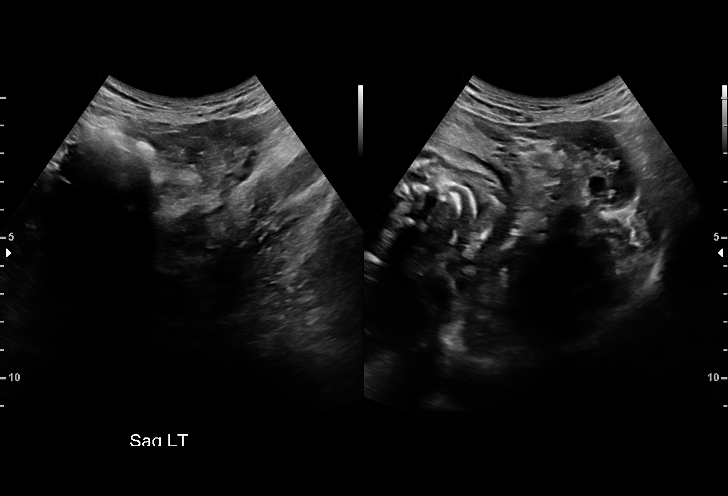
[im 7/46]
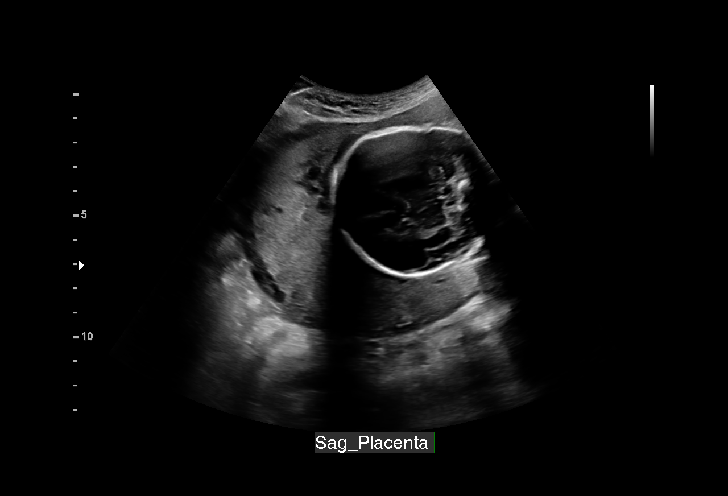
[im 11/46]
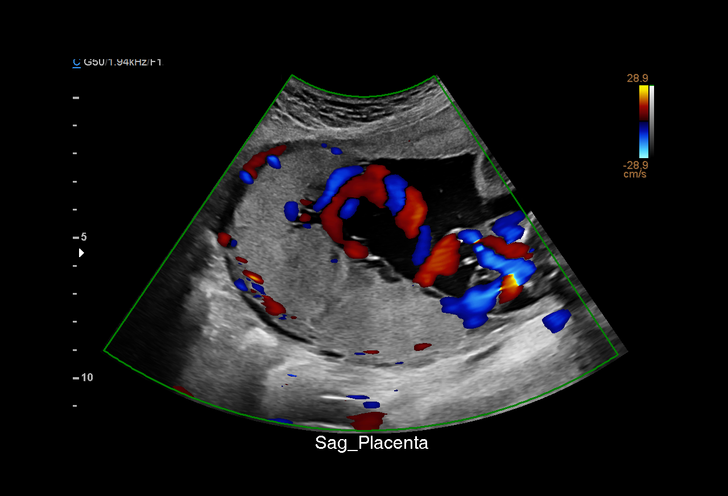
[im 14/46]
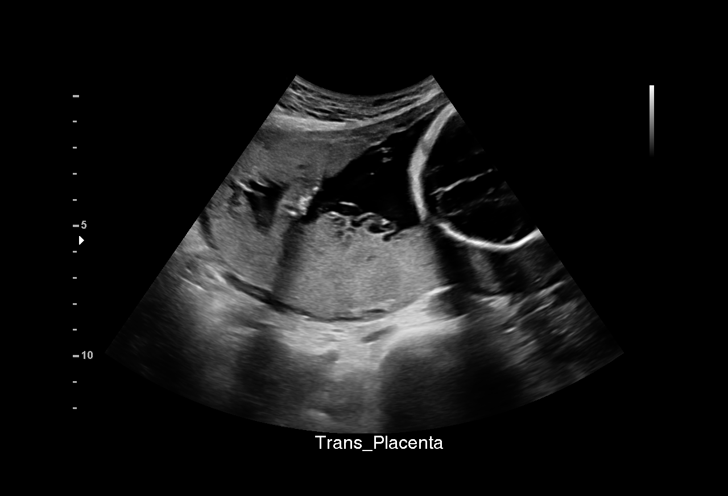
[im 17/46]
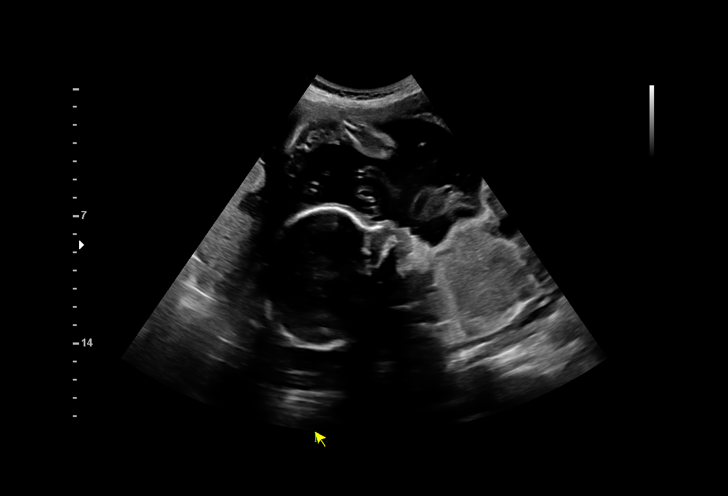
[im 21/46]
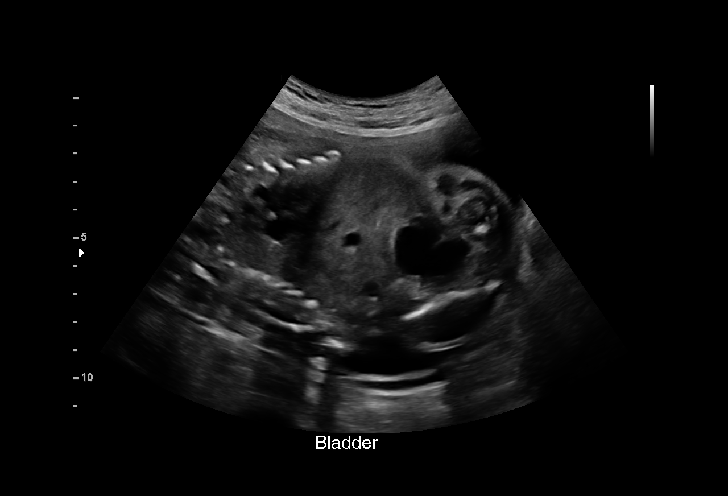
[im 24/46]
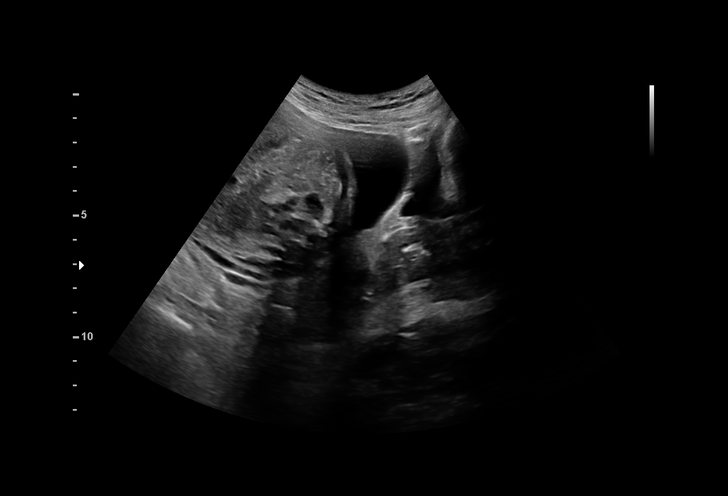
[im 26/46]
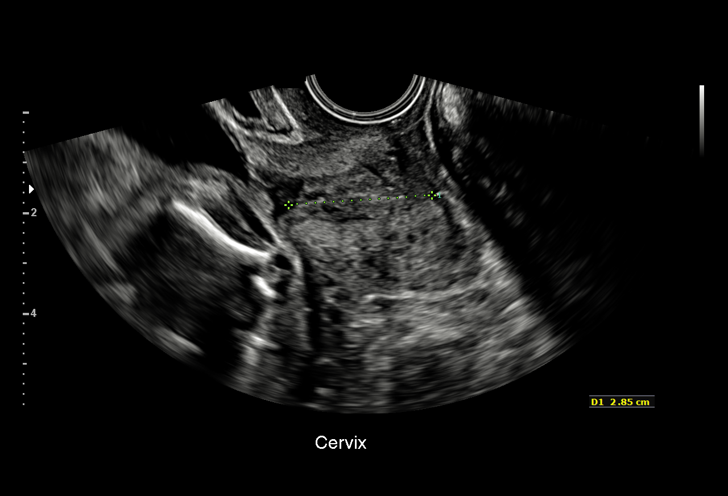
[im 29/46]
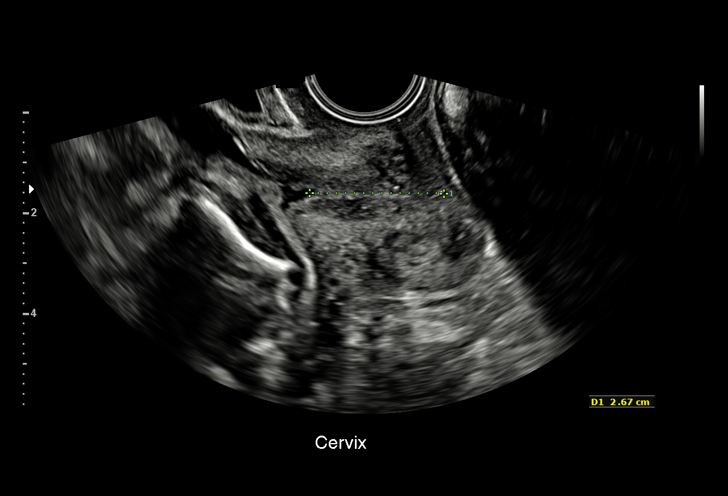
[im 32/46]
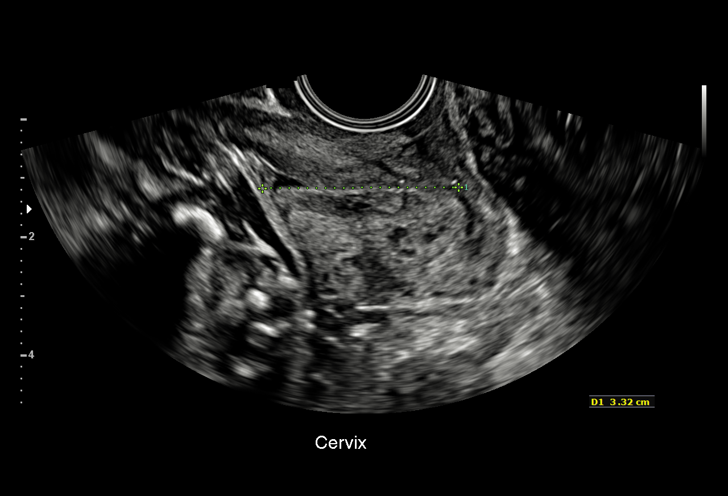
[im 36/46]
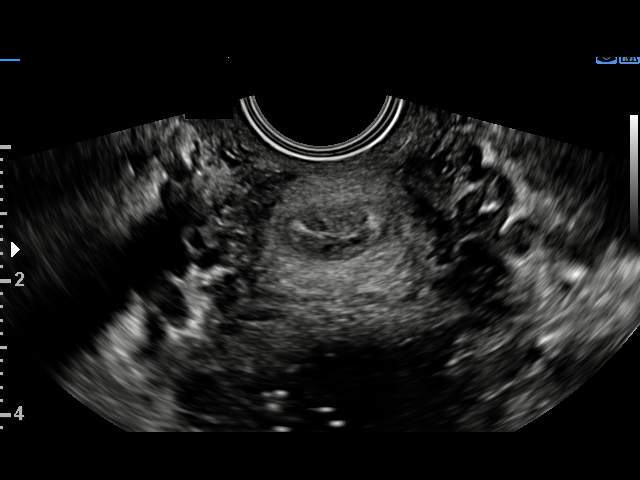
[im 39/46]
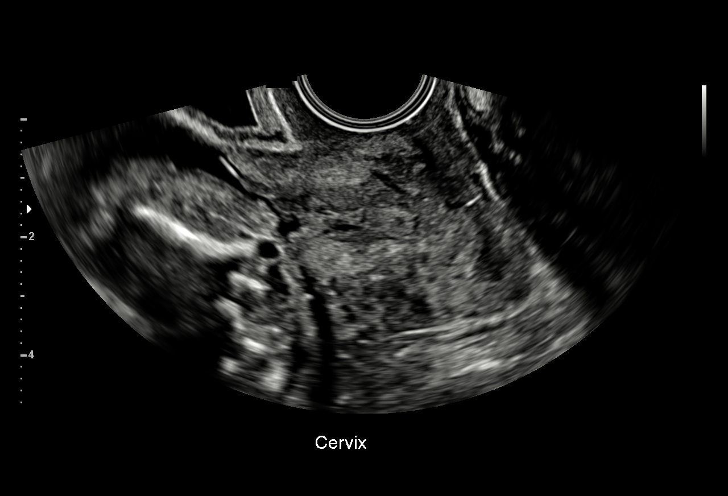
[im 42/46]
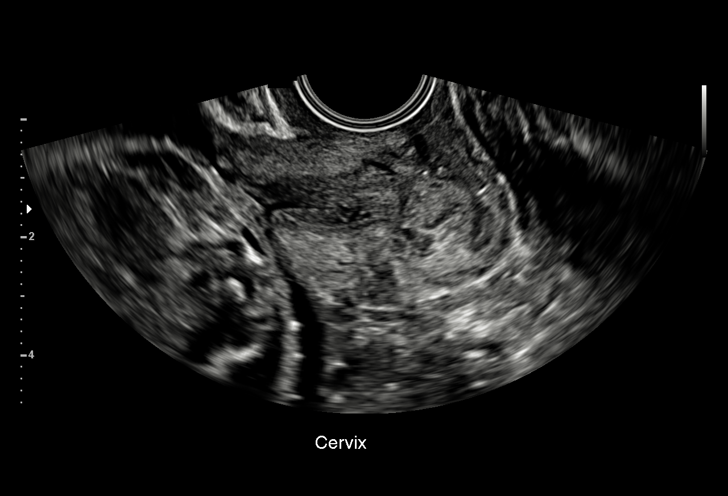
[im 46/46]
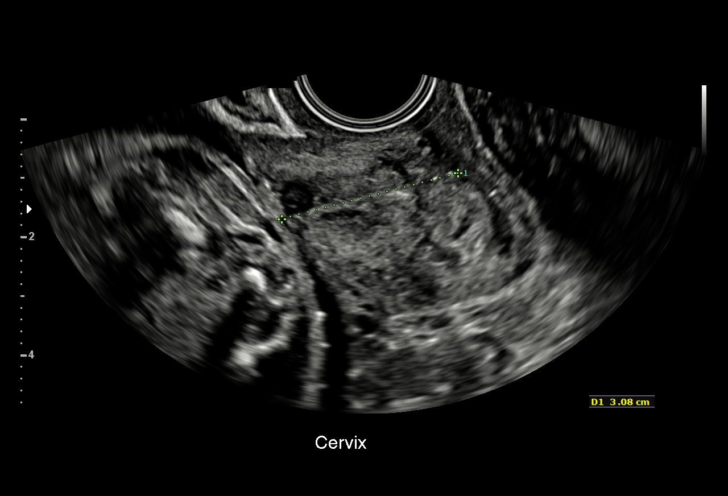

[15 of 28 positions shown; findings below may reference images not displayed]

Indications

25 weeks gestation of pregnancy
Previous cervical surgery (LEEP)
Abdominal pain in pregnancy
History of cesarean delivery, currently
pregnant
OB History

Gravidity:    2         Term:   1        Prem:   0        SAB:   0
TOP:          0       Ectopic:  0        Living: 1
Fetal Evaluation

Num Of Fetuses:     1
Fetal Heart         152
Rate(bpm):
Cardiac Activity:   Observed
Presentation:       Breech
Placenta:           Posterior Fundal, above cervical os
P. Cord Insertion:  Visualized, central

Amniotic Fluid
AFI FV:      Subjectively within normal limits

Largest Pocket(cm)
5.11

Comment:    Stomach and bladder visualized.
Gestational Age

Clinical EDD:  25w 1d                                        EDD:   08/10/16
Best:          25w 1d     Det. By:  Clinical EDD             EDD:   08/10/16
Cervix Uterus Adnexa

Cervix
Length:           3.11  cm.
Normal appearance by transvaginal scan

Uterus
No abnormality visualized.

Left Ovary
Not visualized.

Right Ovary
Not visualized.

Adnexa:       No abnormality visualized.
Impression

Single living intrauterine pregnancy at 74w1d.
Breech presentation.
Posterior fundal placenta without evidence of previa.
Normal amniotic fluid volume.
The cervix measures 3.11cm without funneling or debris.
Recommendations

Follow-up as clinically indicated.

## 2021-04-05 IMAGING — CR DG CHEST 2V
2 series · 2 of 2 positions shown · non-contrast
Comparison: None.

CLINICAL DATA: Chest pain

EXAM:
CHEST - 2 VIEW

[chest pa]
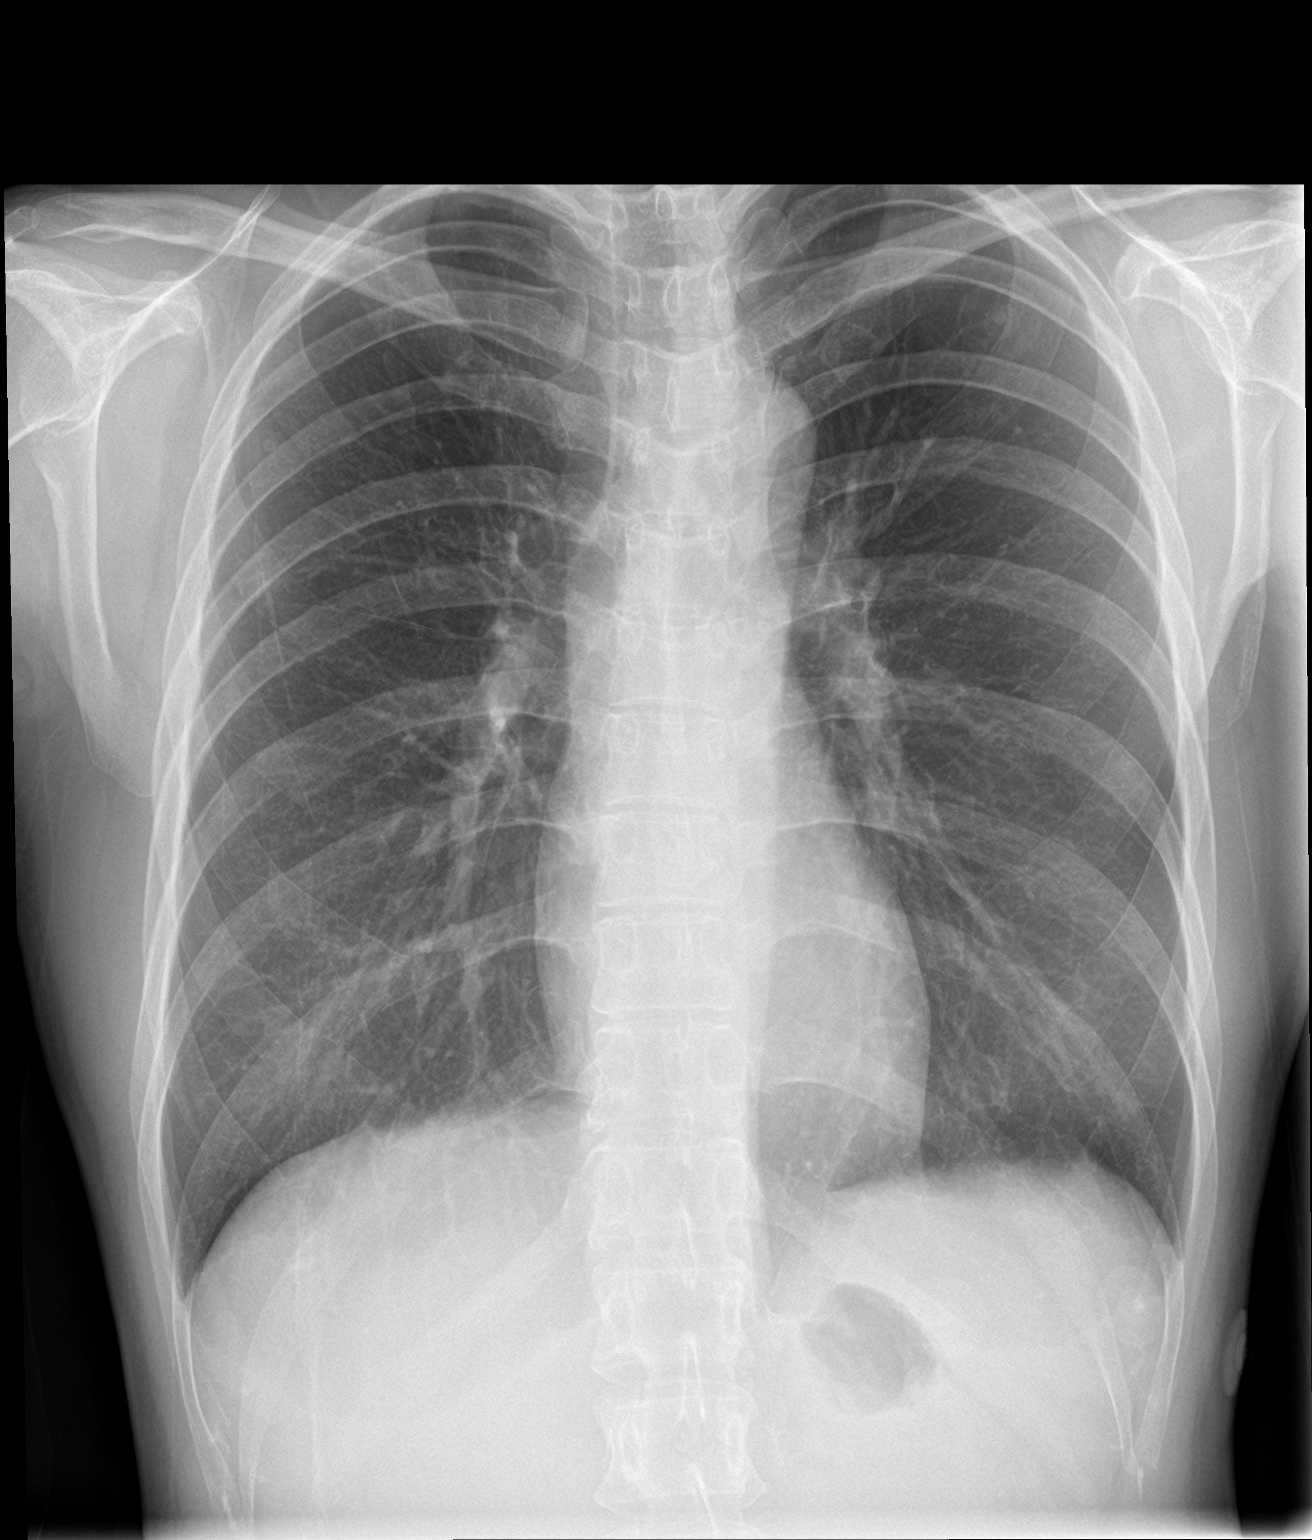

[chest lat]
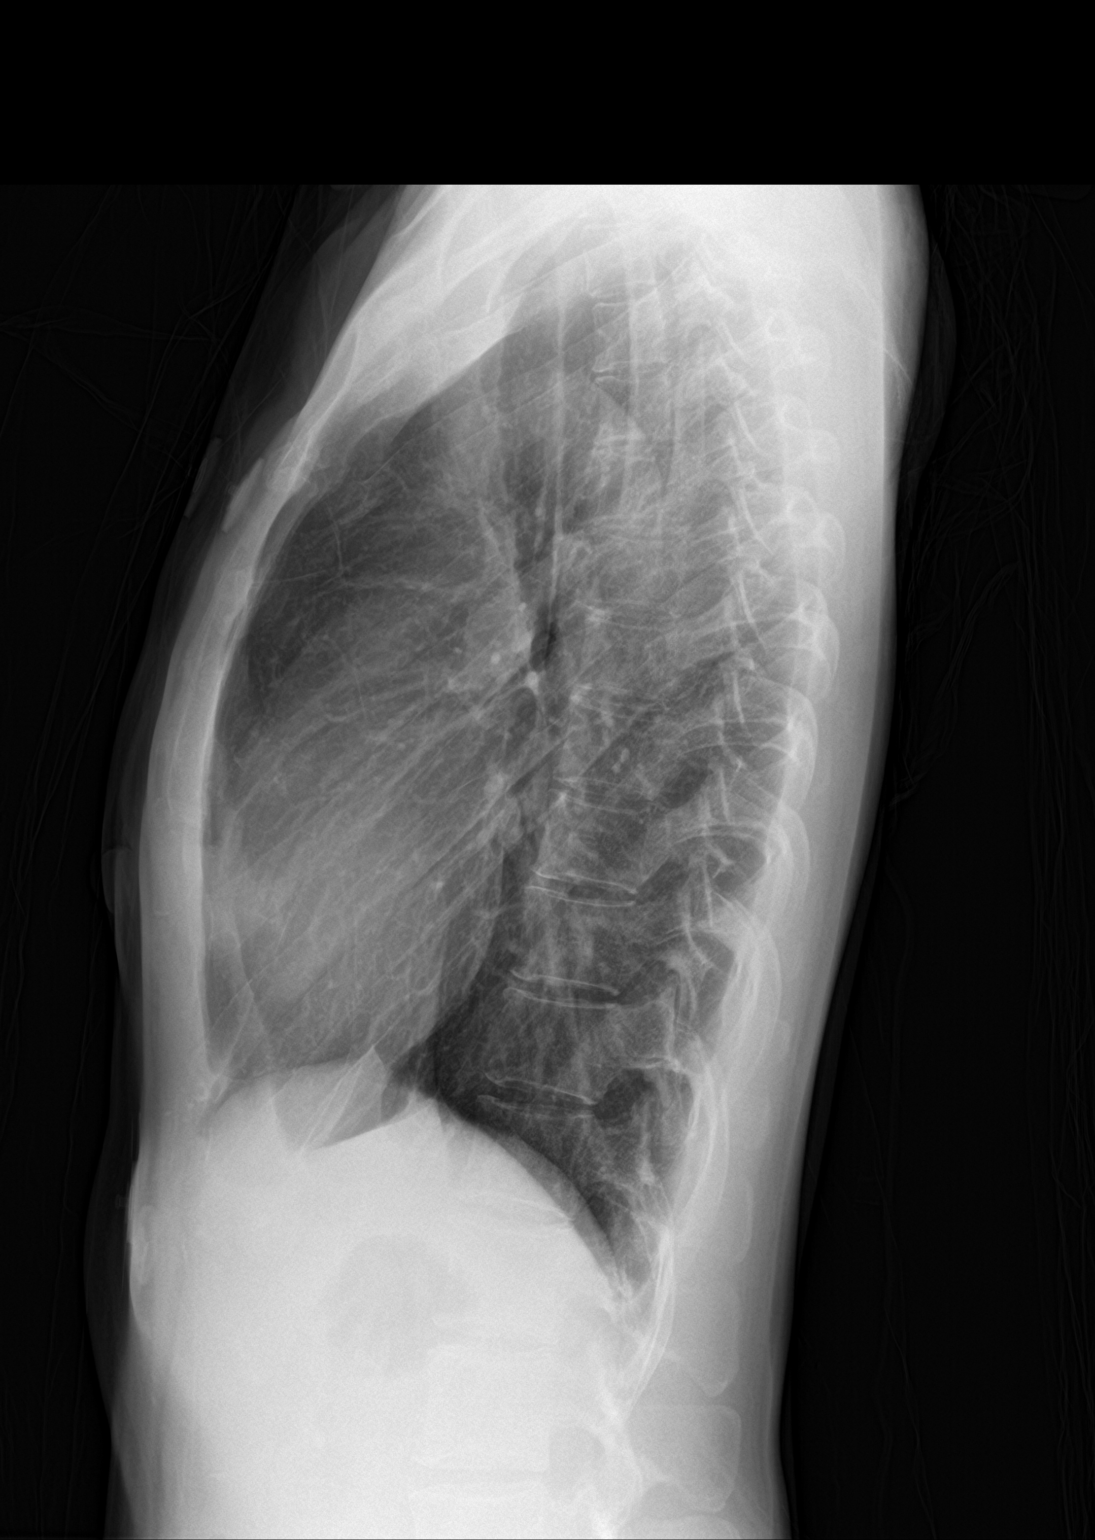

[2 of 2 positions shown; findings below may reference images not displayed]

FINDINGS: The heart size and mediastinal contours are within normal limits.
Both lungs are clear. The visualized skeletal structures are
unremarkable.
IMPRESSION: No active cardiopulmonary disease.
